# Patient Record
Sex: Female | Born: 1940 | Race: White | Hispanic: No | Marital: Married | Smoking: Former smoker
Health system: Southern US, Community
[De-identification: ages and names within clinical notes are randomized; demographics above are authoritative.]

## PROBLEM LIST (undated history)

## (undated) DIAGNOSIS — K589 Irritable bowel syndrome without diarrhea: Secondary | ICD-10-CM

## (undated) DIAGNOSIS — K5790 Diverticulosis of intestine, part unspecified, without perforation or abscess without bleeding: Secondary | ICD-10-CM

## (undated) DIAGNOSIS — M1990 Unspecified osteoarthritis, unspecified site: Secondary | ICD-10-CM

## (undated) DIAGNOSIS — L409 Psoriasis, unspecified: Secondary | ICD-10-CM

## (undated) DIAGNOSIS — I10 Essential (primary) hypertension: Secondary | ICD-10-CM

## (undated) DIAGNOSIS — Z8719 Personal history of other diseases of the digestive system: Secondary | ICD-10-CM

## (undated) DIAGNOSIS — E0500 Thyrotoxicosis with diffuse goiter without thyrotoxic crisis or storm: Secondary | ICD-10-CM

## (undated) DIAGNOSIS — F419 Anxiety disorder, unspecified: Secondary | ICD-10-CM

## (undated) DIAGNOSIS — J189 Pneumonia, unspecified organism: Secondary | ICD-10-CM

## (undated) DIAGNOSIS — F329 Major depressive disorder, single episode, unspecified: Secondary | ICD-10-CM

## (undated) DIAGNOSIS — H409 Unspecified glaucoma: Secondary | ICD-10-CM

## (undated) DIAGNOSIS — R112 Nausea with vomiting, unspecified: Secondary | ICD-10-CM

## (undated) DIAGNOSIS — Z8739 Personal history of other diseases of the musculoskeletal system and connective tissue: Secondary | ICD-10-CM

## (undated) DIAGNOSIS — K219 Gastro-esophageal reflux disease without esophagitis: Secondary | ICD-10-CM

## (undated) DIAGNOSIS — Z9889 Other specified postprocedural states: Secondary | ICD-10-CM

## (undated) DIAGNOSIS — R51 Headache: Secondary | ICD-10-CM

## (undated) DIAGNOSIS — N390 Urinary tract infection, site not specified: Secondary | ICD-10-CM

## (undated) DIAGNOSIS — T8484XA Pain due to internal orthopedic prosthetic devices, implants and grafts, initial encounter: Secondary | ICD-10-CM

## (undated) DIAGNOSIS — F32A Depression, unspecified: Secondary | ICD-10-CM

## (undated) HISTORY — PX: CHOLECYSTECTOMY: SHX55

## (undated) HISTORY — PX: KNEE ARTHROSCOPY W/ ACL RECONSTRUCTION: SHX1858

## (undated) HISTORY — PX: HIP SURGERY: SHX245

## (undated) HISTORY — PX: TOTAL HIP ARTHROPLASTY: SHX124

---

## 1998-12-03 ENCOUNTER — Other Ambulatory Visit: Admission: RE | Admit: 1998-12-03 | Discharge: 1998-12-03 | Payer: Self-pay | Admitting: Gynecology

## 1999-12-22 ENCOUNTER — Other Ambulatory Visit: Admission: RE | Admit: 1999-12-22 | Discharge: 1999-12-22 | Payer: Self-pay | Admitting: Gynecology

## 2000-05-04 ENCOUNTER — Encounter: Admission: RE | Admit: 2000-05-04 | Discharge: 2000-05-04 | Payer: Self-pay | Admitting: Gastroenterology

## 2000-05-04 ENCOUNTER — Encounter: Payer: Self-pay | Admitting: Gastroenterology

## 2000-12-13 ENCOUNTER — Other Ambulatory Visit: Admission: RE | Admit: 2000-12-13 | Discharge: 2000-12-13 | Payer: Self-pay | Admitting: Gynecology

## 2007-10-10 ENCOUNTER — Emergency Department (HOSPITAL_COMMUNITY): Admission: EM | Admit: 2007-10-10 | Discharge: 2007-10-10 | Payer: Self-pay | Admitting: *Deleted

## 2008-06-19 ENCOUNTER — Encounter: Admission: RE | Admit: 2008-06-19 | Discharge: 2008-06-19 | Payer: Self-pay | Admitting: Gastroenterology

## 2009-03-19 ENCOUNTER — Encounter: Admission: RE | Admit: 2009-03-19 | Discharge: 2009-03-19 | Payer: Self-pay | Admitting: Gastroenterology

## 2009-05-13 ENCOUNTER — Encounter (INDEPENDENT_AMBULATORY_CARE_PROVIDER_SITE_OTHER): Payer: Self-pay | Admitting: General Surgery

## 2009-05-13 ENCOUNTER — Ambulatory Visit (HOSPITAL_COMMUNITY): Admission: RE | Admit: 2009-05-13 | Discharge: 2009-05-13 | Payer: Self-pay | Admitting: General Surgery

## 2009-10-28 ENCOUNTER — Encounter: Admission: RE | Admit: 2009-10-28 | Discharge: 2009-10-28 | Payer: Self-pay | Admitting: Orthopedic Surgery

## 2009-11-10 ENCOUNTER — Encounter: Admission: RE | Admit: 2009-11-10 | Discharge: 2009-11-10 | Payer: Self-pay | Admitting: Orthopedic Surgery

## 2009-12-16 ENCOUNTER — Encounter: Admission: RE | Admit: 2009-12-16 | Discharge: 2009-12-16 | Payer: Self-pay | Admitting: Orthopedic Surgery

## 2009-12-22 ENCOUNTER — Inpatient Hospital Stay (HOSPITAL_COMMUNITY): Admission: RE | Admit: 2009-12-22 | Discharge: 2009-12-24 | Payer: Self-pay | Admitting: Orthopedic Surgery

## 2010-01-02 ENCOUNTER — Ambulatory Visit (HOSPITAL_COMMUNITY): Admission: AD | Admit: 2010-01-02 | Discharge: 2010-01-03 | Payer: Self-pay | Admitting: Orthopedic Surgery

## 2010-02-05 ENCOUNTER — Encounter: Admission: RE | Admit: 2010-02-05 | Discharge: 2010-02-05 | Payer: Self-pay | Admitting: Gynecology

## 2010-03-03 ENCOUNTER — Encounter: Admission: RE | Admit: 2010-03-03 | Discharge: 2010-03-03 | Payer: Self-pay | Admitting: Orthopedic Surgery

## 2010-03-20 ENCOUNTER — Inpatient Hospital Stay (HOSPITAL_COMMUNITY): Admission: RE | Admit: 2010-03-20 | Discharge: 2010-03-25 | Payer: Self-pay | Admitting: Orthopedic Surgery

## 2010-03-20 DIAGNOSIS — T8450XA Infection and inflammatory reaction due to unspecified internal joint prosthesis, initial encounter: Secondary | ICD-10-CM | POA: Insufficient documentation

## 2010-03-24 ENCOUNTER — Ambulatory Visit: Payer: Self-pay | Admitting: Infectious Disease

## 2010-04-06 ENCOUNTER — Inpatient Hospital Stay (HOSPITAL_COMMUNITY): Admission: EM | Admit: 2010-04-06 | Discharge: 2010-04-13 | Payer: Self-pay | Admitting: Emergency Medicine

## 2010-04-16 ENCOUNTER — Encounter: Payer: Self-pay | Admitting: Infectious Diseases

## 2010-04-20 ENCOUNTER — Encounter: Payer: Self-pay | Admitting: Infectious Diseases

## 2010-04-27 ENCOUNTER — Encounter: Payer: Self-pay | Admitting: Infectious Diseases

## 2010-05-04 ENCOUNTER — Encounter: Payer: Self-pay | Admitting: Infectious Diseases

## 2010-05-11 ENCOUNTER — Telehealth: Payer: Self-pay

## 2010-05-20 ENCOUNTER — Ambulatory Visit: Payer: Self-pay | Admitting: Infectious Diseases

## 2010-05-20 ENCOUNTER — Encounter: Payer: Self-pay | Admitting: Infectious Diseases

## 2010-05-20 ENCOUNTER — Telehealth: Payer: Self-pay | Admitting: Infectious Diseases

## 2010-05-20 DIAGNOSIS — K219 Gastro-esophageal reflux disease without esophagitis: Secondary | ICD-10-CM

## 2010-05-20 DIAGNOSIS — M949 Disorder of cartilage, unspecified: Secondary | ICD-10-CM

## 2010-05-20 DIAGNOSIS — M899 Disorder of bone, unspecified: Secondary | ICD-10-CM | POA: Insufficient documentation

## 2010-05-20 DIAGNOSIS — I10 Essential (primary) hypertension: Secondary | ICD-10-CM | POA: Insufficient documentation

## 2010-05-20 DIAGNOSIS — M1990 Unspecified osteoarthritis, unspecified site: Secondary | ICD-10-CM | POA: Insufficient documentation

## 2010-05-25 ENCOUNTER — Encounter: Payer: Self-pay | Admitting: Infectious Diseases

## 2010-05-26 ENCOUNTER — Telehealth: Payer: Self-pay | Admitting: Infectious Diseases

## 2010-06-17 ENCOUNTER — Inpatient Hospital Stay (HOSPITAL_COMMUNITY): Admission: RE | Admit: 2010-06-17 | Discharge: 2010-06-24 | Payer: Self-pay | Admitting: Orthopedic Surgery

## 2010-12-20 ENCOUNTER — Encounter: Payer: Self-pay | Admitting: Orthopedic Surgery

## 2010-12-31 NOTE — Miscellaneous (Signed)
Summary: HIPAA Restrictions  HIPAA Restrictions   Imported By: Florinda Marker 05/20/2010 15:10:13  _____________________________________________________________________  External Attachment:    Type:   Image     Comment:   External Document

## 2010-12-31 NOTE — Assessment & Plan Note (Signed)
Summary: Office Visit (Infectious Disease)    CC:  new patient / hsfu.  History of Present Illness: 70 year old female, who had a right total hip arthroplasty done in [place], [place] in 2005.  She did well until last year when she developed painful swelling in the right thigh.  She presented with a draining abscess.  She has undergone two incisions and drainage and washout, but unable to eradicate the infection.  Recent CT showed communication with the joint and on March 20, 2010 she underwent joint resection. She was d/c home on April 27 on vanco and zosyn IV. Her cx showed diphtheroids. Since resection she has had a fall with femur fracture.  No fevers or chills.  Her ESR at d/c was 55, most recent (05-19-10) 10  Preventive Screening-Counseling & Management  Alcohol-Tobacco     Alcohol drinks/day: 0     Smoking Status: never  Caffeine-Diet-Exercise     Caffeine use/day: coffee,sodas occassionally     Does Patient Exercise: yes     Type of exercise: PT     Exercise (avg: min/session): 30-60     Times/week: <3  Safety-Violence-Falls     Seat Belt Use: yes   Updated Prior Medication List: ASPIRIN 81 MG TABS (ASPIRIN) Take one tablet by mouth once a day. TRAVATAN Z 0.004 % SOLN (TRAVOPROST) One drop in each eye at night ZEGERID 40-1100 MG CAPS (OMEPRAZOLE-SODIUM BICARBONATE) Take one capsule by mouth once a day LOTREL 10-20 MG CAPS (AMLODIPINE BESY-BENAZEPRIL HCL) Take one capsule by mouth daily VANCOMYCIN HCL 1000 MG SOLR (VANCOMYCIN HCL)  CEFAZOLIN SODIUM 1 GM SOLR (CEFAZOLIN SODIUM)   Current Allergies (reviewed today): No known allergies  Past History:  Past Medical History: GERD Hypertension Osteoarthritis Osteopenia Resection of Infected R THR 03-20-10. Cx negative.   Family History: CAD- mother died at 29 Family History Diabetes 1st degree relative  Social History: Married Never Smoked Alcohol use-yes- occas   Review of Systems       not eating  well, small portions, more protein; taking occas colace or miralax; passing urine well; no problems with PIC- no erythema, tenderness. has been itchy and has not drawn well. has had thrush.   Vital Signs:  Patient profile:   70 year old female Height:      64.5 inches (163.83 cm) Temp:     98.0 degrees F (36.67 degrees C) oral Pulse rate:   88 / minute BP sitting:   106 / 68  (left arm)  Vitals Entered By: Baxter Hire) (May 20, 2010 11:37 AM) CC: new patient / hsfu Pain Assessment Patient in pain? yes     Location: upper leg Intensity: 2 Type: dull ache Onset of pain  only happens when leg is moved Nutritional Status Detail appetite is better per patient  Does patient need assistance? Functional Status Self care Ambulation Wheelchair Comments patient uses both wheelchair and walker   Physical Exam  General:  well-developed, well-nourished, well-hydrated, and overweight-appearing.   Eyes:  pupils equal, pupils round, and pupils reactive to light.   Mouth:  pharynx pink and moist and no exudates.   Neck:  no masses.   Lungs:  normal respiratory effort and normal breath sounds.   Heart:  normal rate, regular rhythm, and no murmur.   Abdomen:  soft, non-tender, and normal bowel sounds.   Extremities:  R hip- subcutaneously edema. wound well healed, non-fluctuant. no erythema, non-tender.    Impression & Recommendations:  Problem # 1:  INF&INFLAM REACTION DUE  INTRL JOINT PROSTHESIS [phone])  she seems to be doing very well. would - d/c antibiotics, she has completed more than 10 weeks her original plan. start her on doxy by mouth for suppresive therapy through surgery. plan for reimplantation provided her CRP and ESR stay normal. her husband would like to leave pic in for the next week while watching off IV antibiotics and waiting for repeat labs. I agree with this.   Orders: Consultation Level IV ([id number])  Medications Added to Medication List This Visit: 1)   Aspirin 81 Mg Tabs (Aspirin) .... Take one tablet by mouth once a day. 2)  Travatan Z 0.004 % Soln (Travoprost) .... One drop in each eye at night 3)  Zegerid 40-1100 Mg Caps (Omeprazole-sodium bicarbonate) .... Take one capsule by mouth once a day 4)  Lotrel 10-20 Mg Caps (Amlodipine besy-benazepril hcl) .... Take one capsule by mouth daily 5)  Doxycycline Hyclate 100 Mg Caps (Doxycycline hyclate) .... Take 1 tablet by mouth two times a day Prescriptions: DOXYCYCLINE HYCLATE 100 MG CAPS (DOXYCYCLINE HYCLATE) Take 1 tablet by mouth two times a day  #90 x 0   Entered and Authorized by:   Johny Sax MD   Signed by:   Johny Sax MD on 05/20/2010   Method used:   Print then Give to Patient   RxID:   [id number]

## 2010-12-31 NOTE — Progress Notes (Signed)
Summary: patient has concern about picc being removed  Phone Note Call from Patient   Caller: Spouse Call For: Dr. Johny Sax Summary of Call: Patient's husband has concern about his wife's picc line being pulled today, and the increase in sed rate from 10 last week to 15 this week. Please advise Initial call taken by: Starleen Arms CMA,  May 26, 2010 4:32 PM  Follow-up for Phone Call        this is normal. please pull pic.

## 2010-12-31 NOTE — Progress Notes (Signed)
Summary: ABX, lab and PICC orders called to [facility] per Dr. Ninetta Lights  Phone Note Outgoing Call   Call placed by: Jennet Maduro RN,  May 20, 2010 12:47 PM Call placed to: [facility] Action Taken: Phone Call Completed Summary of Call: Per order from Dr. Ninetta Lights, stop antibiotics, maintain PICC, draw labs on Monday, June 27.  MD will review labs prior to deciding further instructions regarding PICC.  Genevieve Norlander RN verbalized back these orders.  Jennet Maduro RN  May 20, 2010 12:49 PM      G

## 2010-12-31 NOTE — Progress Notes (Signed)
Summary: Orders for [facility] line  Phone Note Other Incoming   Caller: Verlon Au from [place] Summary of Call: Verlon Au from [place] called about this patient and is wanting to know about pulling PIC line. Nurse stated that labs were drawn and are waiting for instructions. Told nurse that physician would be in this afternoon and would be advised on further instructions and would call back with instructions.  Initial call taken by: Kathi Simpers [facility]),  May 26, 2010 11:12 AM  Follow-up for Phone Call        labs (ESR and CRP nl). please pull PIC     Appended Document: Orders for [facility] line Spoke with Verlon Au from [place] and gave verbal orders to pull [facility] as prescribed by Dr. Ninetta Lights. Wise,Paula, CMA May 26, 2010 at 4:19pm.

## 2010-12-31 NOTE — Progress Notes (Signed)
Summary: Abnormal labs   Phone Note Outgoing Call   Call placed by: Tomasita Morrow RN,  May 11, 2010 12:37 PM Summary of Call: Lab rcv'd dated 05-04-10   Vanc trough 23.4.  Per Dr Ninetta Lights, Vanc trough needs to be repeated today.  Genevieve Norlander states they will repeat the lab today anyway per standing orders. Tomasita Morrow RN  May 11, 2010 12:39 PM

## 2011-01-04 ENCOUNTER — Ambulatory Visit: Payer: Medicare Other | Attending: Orthopedic Surgery | Admitting: Physical Therapy

## 2011-01-04 DIAGNOSIS — R269 Unspecified abnormalities of gait and mobility: Secondary | ICD-10-CM | POA: Insufficient documentation

## 2011-01-04 DIAGNOSIS — IMO0001 Reserved for inherently not codable concepts without codable children: Secondary | ICD-10-CM | POA: Insufficient documentation

## 2011-01-04 DIAGNOSIS — Z96649 Presence of unspecified artificial hip joint: Secondary | ICD-10-CM | POA: Insufficient documentation

## 2011-01-12 ENCOUNTER — Ambulatory Visit: Payer: Medicare Other | Admitting: Physical Therapy

## 2011-01-14 ENCOUNTER — Ambulatory Visit: Payer: Medicare Other | Admitting: Physical Therapy

## 2011-01-19 ENCOUNTER — Ambulatory Visit: Payer: Medicare Other | Admitting: Physical Therapy

## 2011-01-21 ENCOUNTER — Ambulatory Visit: Payer: Medicare Other | Admitting: Physical Therapy

## 2011-01-26 ENCOUNTER — Ambulatory Visit: Payer: BC Managed Care – PPO | Admitting: Physical Therapy

## 2011-01-26 ENCOUNTER — Ambulatory Visit: Payer: Medicare Other | Admitting: Physical Therapy

## 2011-01-28 ENCOUNTER — Ambulatory Visit: Payer: Medicare Other | Attending: Orthopedic Surgery | Admitting: Physical Therapy

## 2011-01-28 DIAGNOSIS — R269 Unspecified abnormalities of gait and mobility: Secondary | ICD-10-CM | POA: Insufficient documentation

## 2011-01-28 DIAGNOSIS — Z96649 Presence of unspecified artificial hip joint: Secondary | ICD-10-CM | POA: Insufficient documentation

## 2011-01-28 DIAGNOSIS — IMO0001 Reserved for inherently not codable concepts without codable children: Secondary | ICD-10-CM | POA: Insufficient documentation

## 2011-02-02 ENCOUNTER — Ambulatory Visit: Payer: Medicare Other | Admitting: Physical Therapy

## 2011-02-04 ENCOUNTER — Ambulatory Visit: Payer: Medicare Other | Admitting: Physical Therapy

## 2011-02-09 ENCOUNTER — Ambulatory Visit: Payer: Medicare Other | Admitting: Physical Therapy

## 2011-02-11 ENCOUNTER — Ambulatory Visit: Payer: Medicare Other | Admitting: Physical Therapy

## 2011-02-13 LAB — ANAEROBIC CULTURE

## 2011-02-13 LAB — CBC
HCT: 26.5 % — ABNORMAL LOW (ref 36.0–46.0)
HCT: 27 % — ABNORMAL LOW (ref 36.0–46.0)
HCT: 40.2 % (ref 36.0–46.0)
Hemoglobin: 10.9 g/dL — ABNORMAL LOW (ref 12.0–15.0)
Hemoglobin: 13.7 g/dL (ref 12.0–15.0)
Hemoglobin: 9.2 g/dL — ABNORMAL LOW (ref 12.0–15.0)
MCH: 31.1 pg (ref 26.0–34.0)
MCH: 31.9 pg (ref 26.0–34.0)
MCHC: 33.8 g/dL (ref 30.0–36.0)
MCHC: 34 g/dL (ref 30.0–36.0)
MCHC: 34.1 g/dL (ref 30.0–36.0)
MCHC: 34.7 g/dL (ref 30.0–36.0)
MCV: 91.6 fL (ref 78.0–100.0)
Platelets: 142 10*3/uL — ABNORMAL LOW (ref 150–400)
Platelets: 168 10*3/uL (ref 150–400)
Platelets: 194 10*3/uL (ref 150–400)
Platelets: 231 10*3/uL (ref 150–400)
RBC: 2.82 MIL/uL — ABNORMAL LOW (ref 3.87–5.11)
RBC: 2.84 MIL/uL — ABNORMAL LOW (ref 3.87–5.11)
RBC: 2.92 MIL/uL — ABNORMAL LOW (ref 3.87–5.11)
RBC: 2.97 MIL/uL — ABNORMAL LOW (ref 3.87–5.11)
RBC: 4.39 MIL/uL (ref 3.87–5.11)
RDW: 14.4 % (ref 11.5–15.5)
RDW: 15 % (ref 11.5–15.5)
WBC: 10 10*3/uL (ref 4.0–10.5)
WBC: 11 10*3/uL — ABNORMAL HIGH (ref 4.0–10.5)
WBC: 14.5 10*3/uL — ABNORMAL HIGH (ref 4.0–10.5)
WBC: 5.9 10*3/uL (ref 4.0–10.5)
WBC: 6.4 10*3/uL (ref 4.0–10.5)
WBC: 7.4 10*3/uL (ref 4.0–10.5)
WBC: 8.6 10*3/uL (ref 4.0–10.5)

## 2011-02-13 LAB — PROTIME-INR
INR: 1.44 (ref 0.00–1.49)
INR: 1.65 — ABNORMAL HIGH (ref 0.00–1.49)
INR: 1.89 — ABNORMAL HIGH (ref 0.00–1.49)
INR: 2.39 — ABNORMAL HIGH (ref 0.00–1.49)
Prothrombin Time: 13.3 seconds (ref 11.6–15.2)
Prothrombin Time: 17.4 seconds — ABNORMAL HIGH (ref 11.6–15.2)
Prothrombin Time: 18.6 seconds — ABNORMAL HIGH (ref 11.6–15.2)
Prothrombin Time: 20.7 seconds — ABNORMAL HIGH (ref 11.6–15.2)
Prothrombin Time: 21.5 seconds — ABNORMAL HIGH (ref 11.6–15.2)
Prothrombin Time: 22.8 seconds — ABNORMAL HIGH (ref 11.6–15.2)

## 2011-02-13 LAB — BASIC METABOLIC PANEL
BUN: 10 mg/dL (ref 6–23)
Calcium: 7.8 mg/dL — ABNORMAL LOW (ref 8.4–10.5)
Calcium: 7.9 mg/dL — ABNORMAL LOW (ref 8.4–10.5)
Calcium: 8 mg/dL — ABNORMAL LOW (ref 8.4–10.5)
Creatinine, Ser: 0.72 mg/dL (ref 0.4–1.2)
Creatinine, Ser: 0.94 mg/dL (ref 0.4–1.2)
GFR calc Af Amer: 47 mL/min — ABNORMAL LOW (ref >60)
GFR calc Af Amer: 55 mL/min — ABNORMAL LOW (ref >60)
GFR calc Af Amer: >60 mL/min (ref >60)
GFR calc Af Amer: >60 mL/min (ref >60)
GFR calc non Af Amer: 39 mL/min — ABNORMAL LOW (ref >60)
GFR calc non Af Amer: 46 mL/min — ABNORMAL LOW (ref >60)
GFR calc non Af Amer: 60 mL/min — ABNORMAL LOW (ref >60)
Glucose, Bld: 102 mg/dL — ABNORMAL HIGH (ref 70–99)
Potassium: 3.9 mEq/L (ref 3.5–5.1)
Potassium: 5 mEq/L (ref 3.5–5.1)
Sodium: 133 mEq/L — ABNORMAL LOW (ref 135–145)
Sodium: 138 mEq/L (ref 135–145)

## 2011-02-13 LAB — TYPE AND SCREEN

## 2011-02-13 LAB — BODY FLUID CULTURE
Culture: NO GROWTH
Gram Stain: NONE SEEN

## 2011-02-13 LAB — COMPREHENSIVE METABOLIC PANEL
AST: 25 U/L (ref 0–37)
CO2: 27 mEq/L (ref 19–32)
Calcium: 9.4 mg/dL (ref 8.4–10.5)
Chloride: 106 mEq/L (ref 96–112)
Creatinine, Ser: 1.05 mg/dL (ref 0.4–1.2)
GFR calc non Af Amer: 52 mL/min — ABNORMAL LOW (ref >60)
Glucose, Bld: 108 mg/dL — ABNORMAL HIGH (ref 70–99)
Total Bilirubin: 0.8 mg/dL (ref 0.3–1.2)

## 2011-02-13 LAB — POCT I-STAT 4, (NA,K, GLUC, HGB,HCT)
HCT: 29 % — ABNORMAL LOW (ref 36.0–46.0)
Hemoglobin: 9.9 g/dL — ABNORMAL LOW (ref 12.0–15.0)
Potassium: 3.5 mEq/L (ref 3.5–5.1)
Sodium: 142 mEq/L (ref 135–145)

## 2011-02-13 LAB — URINE MICROSCOPIC-ADD ON

## 2011-02-13 LAB — GRAM STAIN

## 2011-02-13 LAB — URINALYSIS, ROUTINE W REFLEX MICROSCOPIC
Bilirubin Urine: NEGATIVE
Ketones, ur: NEGATIVE mg/dL
Specific Gravity, Urine: 1.02 (ref 1.005–1.030)

## 2011-02-13 LAB — PREPARE RBC (CROSSMATCH)

## 2011-02-13 LAB — SURGICAL PCR SCREEN: MRSA, PCR: NEGATIVE

## 2011-02-13 LAB — APTT: aPTT: 23 seconds — ABNORMAL LOW (ref 24–37)

## 2011-02-14 LAB — URINE MICROSCOPIC-ADD ON

## 2011-02-14 LAB — BASIC METABOLIC PANEL
BUN: 11 mg/dL (ref 6–23)
Calcium: 8.3 mg/dL — ABNORMAL LOW (ref 8.4–10.5)
Calcium: 8.4 mg/dL (ref 8.4–10.5)
GFR calc Af Amer: >60 mL/min (ref >60)
GFR calc non Af Amer: 60 mL/min — ABNORMAL LOW (ref >60)
GFR calc non Af Amer: >60 mL/min (ref >60)
Glucose, Bld: 93 mg/dL (ref 70–99)
Sodium: 134 mEq/L — ABNORMAL LOW (ref 135–145)
Sodium: 137 mEq/L (ref 135–145)

## 2011-02-14 LAB — COMPREHENSIVE METABOLIC PANEL
Albumin: 3.3 g/dL — ABNORMAL LOW (ref 3.5–5.2)
Alkaline Phosphatase: 69 U/L (ref 39–117)
BUN: 15 mg/dL (ref 6–23)
Calcium: 8.8 mg/dL (ref 8.4–10.5)
Creatinine, Ser: 0.99 mg/dL (ref 0.4–1.2)
Potassium: 3.9 mEq/L (ref 3.5–5.1)
Total Protein: 6.7 g/dL (ref 6.0–8.3)

## 2011-02-14 LAB — CBC
HCT: 44.2 % (ref 36.0–46.0)
Hemoglobin: 12.9 g/dL (ref 12.0–15.0)
MCHC: 34 g/dL (ref 30.0–36.0)
Platelets: 246 10*3/uL (ref 150–400)
Platelets: 296 10*3/uL (ref 150–400)
RDW: 13.3 % (ref 11.5–15.5)
RDW: 13.7 % (ref 11.5–15.5)

## 2011-02-14 LAB — ANAEROBIC CULTURE

## 2011-02-14 LAB — TISSUE CULTURE

## 2011-02-14 LAB — URINALYSIS, ROUTINE W REFLEX MICROSCOPIC
Glucose, UA: NEGATIVE mg/dL
Specific Gravity, Urine: 1.024 (ref 1.005–1.030)
pH: 6.5 (ref 5.0–8.0)

## 2011-02-14 LAB — WOUND CULTURE: Culture: NO GROWTH

## 2011-02-14 LAB — GRAM STAIN

## 2011-02-15 ENCOUNTER — Ambulatory Visit: Payer: BC Managed Care – PPO | Admitting: Physical Therapy

## 2011-02-15 LAB — PROTIME-INR
INR: 1.98 — ABNORMAL HIGH (ref 0.00–1.49)
Prothrombin Time: 23.8 seconds — ABNORMAL HIGH (ref 11.6–15.2)

## 2011-02-16 ENCOUNTER — Ambulatory Visit: Payer: Medicare Other | Admitting: Physical Therapy

## 2011-02-16 LAB — PROTIME-INR
INR: 1.57 — ABNORMAL HIGH (ref 0.00–1.49)
INR: 1.22 (ref 0.00–1.49)
INR: 1.23 (ref 0.00–1.49)
Prothrombin Time: 13.5 seconds (ref 11.6–15.2)
Prothrombin Time: 14.3 seconds (ref 11.6–15.2)
Prothrombin Time: 16.9 seconds — ABNORMAL HIGH (ref 11.6–15.2)
Prothrombin Time: 18.8 seconds — ABNORMAL HIGH (ref 11.6–15.2)
Prothrombin Time: 20.3 seconds — ABNORMAL HIGH (ref 11.6–15.2)
Prothrombin Time: 13.3 seconds (ref 11.6–15.2)
Prothrombin Time: 14.3 seconds (ref 11.6–15.2)
Prothrombin Time: 15.3 seconds — ABNORMAL HIGH (ref 11.6–15.2)
Prothrombin Time: 15.4 seconds — ABNORMAL HIGH (ref 11.6–15.2)

## 2011-02-16 LAB — ANAEROBIC CULTURE

## 2011-02-16 LAB — CBC
HCT: 31.1 % — ABNORMAL LOW (ref 36.0–46.0)
Hemoglobin: 9.9 g/dL — ABNORMAL LOW (ref 12.0–15.0)
MCHC: 32.9 g/dL (ref 30.0–36.0)
MCHC: 33 g/dL (ref 30.0–36.0)
MCHC: 33.9 g/dL (ref 30.0–36.0)
MCHC: 33.3 g/dL (ref 30.0–36.0)
MCHC: 33.6 g/dL (ref 30.0–36.0)
MCHC: 33.8 g/dL (ref 30.0–36.0)
MCHC: 33.8 g/dL (ref 30.0–36.0)
MCV: 92.5 fL (ref 78.0–100.0)
MCV: 93.2 fL (ref 78.0–100.0)
MCV: 93.9 fL (ref 78.0–100.0)
Platelets: 203 10*3/uL (ref 150–400)
Platelets: 203 10*3/uL (ref 150–400)
Platelets: 238 10*3/uL (ref 150–400)
Platelets: 245 10*3/uL (ref 150–400)
Platelets: 221 10*3/uL (ref 150–400)
Platelets: 232 10*3/uL (ref 150–400)
RBC: 2.75 MIL/uL — ABNORMAL LOW (ref 3.87–5.11)
RDW: 14.2 % (ref 11.5–15.5)
RDW: 14.5 % (ref 11.5–15.5)
RDW: 14.9 % (ref 11.5–15.5)
RDW: 14.2 % (ref 11.5–15.5)
RDW: 14.4 % (ref 11.5–15.5)
RDW: 14.5 % (ref 11.5–15.5)
RDW: 14.5 % (ref 11.5–15.5)
RDW: 14.6 % (ref 11.5–15.5)
WBC: 7.8 10*3/uL (ref 4.0–10.5)
WBC: 9.3 10*3/uL (ref 4.0–10.5)

## 2011-02-16 LAB — COMPREHENSIVE METABOLIC PANEL
ALT: 13 U/L (ref 0–35)
Calcium: 9.4 mg/dL (ref 8.4–10.5)
Creatinine, Ser: 1.06 mg/dL (ref 0.4–1.2)
GFR calc Af Amer: >60 mL/min (ref >60)
Glucose, Bld: 92 mg/dL (ref 70–99)
Sodium: 141 mEq/L (ref 135–145)
Total Protein: 7 g/dL (ref 6.0–8.3)

## 2011-02-16 LAB — BASIC METABOLIC PANEL
BUN: 10 mg/dL (ref 6–23)
BUN: 13 mg/dL (ref 6–23)
BUN: 7 mg/dL (ref 6–23)
BUN: 8 mg/dL (ref 6–23)
BUN: 9 mg/dL (ref 6–23)
CO2: 24 mEq/L (ref 19–32)
CO2: 26 mEq/L (ref 19–32)
Calcium: 8.3 mg/dL — ABNORMAL LOW (ref 8.4–10.5)
Calcium: 9.1 mg/dL (ref 8.4–10.5)
Calcium: 8 mg/dL — ABNORMAL LOW (ref 8.4–10.5)
Calcium: 8.1 mg/dL — ABNORMAL LOW (ref 8.4–10.5)
Chloride: 102 mEq/L (ref 96–112)
Creatinine, Ser: 0.67 mg/dL (ref 0.4–1.2)
Creatinine, Ser: 0.76 mg/dL (ref 0.4–1.2)
Creatinine, Ser: 0.76 mg/dL (ref 0.4–1.2)
Creatinine, Ser: 0.83 mg/dL (ref 0.4–1.2)
Creatinine, Ser: 0.83 mg/dL (ref 0.4–1.2)
Creatinine, Ser: 1.01 mg/dL (ref 0.4–1.2)
GFR calc Af Amer: >60 mL/min (ref >60)
GFR calc non Af Amer: >60 mL/min (ref >60)
GFR calc non Af Amer: >60 mL/min (ref >60)
GFR calc non Af Amer: >60 mL/min (ref >60)
GFR calc non Af Amer: >60 mL/min (ref >60)
Glucose, Bld: 114 mg/dL — ABNORMAL HIGH (ref 70–99)
Glucose, Bld: 116 mg/dL — ABNORMAL HIGH (ref 70–99)
Glucose, Bld: 123 mg/dL — ABNORMAL HIGH (ref 70–99)
Glucose, Bld: 110 mg/dL — ABNORMAL HIGH (ref 70–99)
Glucose, Bld: 118 mg/dL — ABNORMAL HIGH (ref 70–99)
Glucose, Bld: 163 mg/dL — ABNORMAL HIGH (ref 70–99)
Potassium: 4.3 mEq/L (ref 3.5–5.1)
Potassium: 4 mEq/L (ref 3.5–5.1)

## 2011-02-16 LAB — URINALYSIS, ROUTINE W REFLEX MICROSCOPIC
Bilirubin Urine: NEGATIVE
Glucose, UA: NEGATIVE mg/dL
Glucose, UA: NEGATIVE mg/dL
Ketones, ur: NEGATIVE mg/dL
Nitrite: NEGATIVE
Protein, ur: NEGATIVE mg/dL
Protein, ur: NEGATIVE mg/dL
Urobilinogen, UA: 0.2 mg/dL (ref 0.0–1.0)

## 2011-02-16 LAB — BODY FLUID CULTURE
Culture: NO GROWTH
Gram Stain: NONE SEEN

## 2011-02-16 LAB — CROSSMATCH

## 2011-02-16 LAB — TYPE AND SCREEN: ABO/RH(D): A POS

## 2011-02-16 LAB — DIFFERENTIAL
Basophils Absolute: 0 10*3/uL (ref 0.0–0.1)
Basophils Relative: 0 % (ref 0–1)
Eosinophils Absolute: 0.4 10*3/uL (ref 0.0–0.7)
Eosinophils Relative: 1 % (ref 0–5)
Lymphocytes Relative: 4 % — ABNORMAL LOW (ref 12–46)
Monocytes Relative: 11 % (ref 3–12)
Neutro Abs: 7.7 10*3/uL (ref 1.7–7.7)
Neutrophils Relative %: 75 % (ref 43–77)

## 2011-02-16 LAB — APTT
aPTT: 27 seconds (ref 24–37)
aPTT: 25 seconds (ref 24–37)

## 2011-02-16 LAB — URINE MICROSCOPIC-ADD ON

## 2011-02-16 LAB — C-REACTIVE PROTEIN: CRP: 12.7 mg/dL — ABNORMAL HIGH (ref <0.6)

## 2011-02-16 LAB — ABO/RH: ABO/RH(D): A POS

## 2011-02-16 LAB — HIV ANTIBODY (ROUTINE TESTING W REFLEX): HIV: NONREACTIVE

## 2011-02-17 LAB — DIFFERENTIAL
Basophils Absolute: 0.1 10*3/uL (ref 0.0–0.1)
Lymphocytes Relative: 11 % — ABNORMAL LOW (ref 12–46)
Lymphs Abs: 0.9 10*3/uL (ref 0.7–4.0)
Neutro Abs: 6.6 10*3/uL (ref 1.7–7.7)
Neutrophils Relative %: 77 % (ref 43–77)

## 2011-02-17 LAB — COMPREHENSIVE METABOLIC PANEL
AST: 35 U/L (ref 0–37)
BUN: 13 mg/dL (ref 6–23)
CO2: 22 mEq/L (ref 19–32)
Calcium: 8.9 mg/dL (ref 8.4–10.5)
Creatinine, Ser: 1 mg/dL (ref 0.4–1.2)
GFR calc Af Amer: >60 mL/min (ref >60)
GFR calc non Af Amer: 55 mL/min — ABNORMAL LOW (ref >60)
Glucose, Bld: 78 mg/dL (ref 70–99)

## 2011-02-17 LAB — PROTIME-INR
INR: 1 (ref 0.00–1.49)
Prothrombin Time: 13.1 seconds (ref 11.6–15.2)

## 2011-02-17 LAB — GRAM STAIN

## 2011-02-17 LAB — ANAEROBIC CULTURE

## 2011-02-17 LAB — CBC
HCT: 39.9 % (ref 36.0–46.0)
MCHC: 34.1 g/dL (ref 30.0–36.0)
MCV: 95 fL (ref 78.0–100.0)
RBC: 4.2 MIL/uL (ref 3.87–5.11)

## 2011-02-17 LAB — CULTURE, ROUTINE-ABSCESS

## 2011-02-18 ENCOUNTER — Ambulatory Visit: Payer: Medicare Other | Admitting: Physical Therapy

## 2011-02-23 ENCOUNTER — Ambulatory Visit: Payer: BC Managed Care – PPO | Admitting: Physical Therapy

## 2011-02-25 ENCOUNTER — Ambulatory Visit: Payer: BC Managed Care – PPO | Admitting: Physical Therapy

## 2011-03-02 ENCOUNTER — Ambulatory Visit: Payer: Medicare Other | Attending: Orthopedic Surgery | Admitting: Physical Therapy

## 2011-03-02 DIAGNOSIS — R269 Unspecified abnormalities of gait and mobility: Secondary | ICD-10-CM | POA: Insufficient documentation

## 2011-03-02 DIAGNOSIS — Z96649 Presence of unspecified artificial hip joint: Secondary | ICD-10-CM | POA: Insufficient documentation

## 2011-03-02 DIAGNOSIS — IMO0001 Reserved for inherently not codable concepts without codable children: Secondary | ICD-10-CM | POA: Insufficient documentation

## 2011-03-04 ENCOUNTER — Ambulatory Visit: Payer: Medicare Other | Admitting: Physical Therapy

## 2011-03-08 ENCOUNTER — Ambulatory Visit: Payer: Medicare Other | Admitting: Physical Therapy

## 2011-03-08 LAB — BASIC METABOLIC PANEL
CO2: 22 mEq/L (ref 19–32)
Chloride: 112 mEq/L (ref 96–112)
Creatinine, Ser: 0.94 mg/dL (ref 0.4–1.2)
GFR calc Af Amer: >60 mL/min (ref >60)

## 2011-03-08 LAB — HEMOGLOBIN AND HEMATOCRIT, BLOOD: HCT: 42.3 % (ref 36.0–46.0)

## 2011-03-16 ENCOUNTER — Ambulatory Visit: Payer: BC Managed Care – PPO | Admitting: Physical Therapy

## 2011-03-18 ENCOUNTER — Ambulatory Visit: Payer: BC Managed Care – PPO | Admitting: Physical Therapy

## 2011-03-22 ENCOUNTER — Ambulatory Visit: Payer: BC Managed Care – PPO | Admitting: Physical Therapy

## 2011-03-23 ENCOUNTER — Ambulatory Visit: Payer: BC Managed Care – PPO | Admitting: Physical Therapy

## 2011-03-25 ENCOUNTER — Ambulatory Visit: Payer: BC Managed Care – PPO | Admitting: Physical Therapy

## 2011-03-25 ENCOUNTER — Ambulatory Visit: Payer: Medicare Other | Admitting: Physical Therapy

## 2011-03-30 ENCOUNTER — Ambulatory Visit: Payer: Medicare Other | Attending: Orthopedic Surgery | Admitting: Physical Therapy

## 2011-03-30 DIAGNOSIS — R269 Unspecified abnormalities of gait and mobility: Secondary | ICD-10-CM | POA: Insufficient documentation

## 2011-03-30 DIAGNOSIS — Z96649 Presence of unspecified artificial hip joint: Secondary | ICD-10-CM | POA: Insufficient documentation

## 2011-03-30 DIAGNOSIS — IMO0001 Reserved for inherently not codable concepts without codable children: Secondary | ICD-10-CM | POA: Insufficient documentation

## 2011-03-31 ENCOUNTER — Encounter (HOSPITAL_COMMUNITY)
Admission: RE | Admit: 2011-03-31 | Discharge: 2011-03-31 | Disposition: A | Discharge status: Home / Self Care | Payer: Medicare Other | Source: Ambulatory Visit | Attending: Neurological Surgery | Admitting: Neurological Surgery

## 2011-03-31 ENCOUNTER — Other Ambulatory Visit (HOSPITAL_COMMUNITY): Payer: Self-pay | Admitting: Neurological Surgery

## 2011-03-31 DIAGNOSIS — M47812 Spondylosis without myelopathy or radiculopathy, cervical region: Secondary | ICD-10-CM

## 2011-03-31 LAB — DIFFERENTIAL
Eosinophils Relative: 1 % (ref 0–5)
Lymphocytes Relative: 17 % (ref 12–46)
Lymphs Abs: 1.5 10*3/uL (ref 0.7–4.0)
Neutrophils Relative %: 72 % (ref 43–77)

## 2011-03-31 LAB — BASIC METABOLIC PANEL
Chloride: 99 mEq/L (ref 96–112)
GFR calc Af Amer: >60 mL/min (ref >60)
Potassium: 5 mEq/L (ref 3.5–5.1)

## 2011-03-31 LAB — CBC
HCT: 39.6 % (ref 36.0–46.0)
MCV: 92.3 fL (ref 78.0–100.0)
RBC: 4.29 MIL/uL (ref 3.87–5.11)
WBC: 8.8 10*3/uL (ref 4.0–10.5)

## 2011-03-31 LAB — APTT: aPTT: 23 seconds — ABNORMAL LOW (ref 24–37)

## 2011-03-31 LAB — SURGICAL PCR SCREEN
MRSA, PCR: NEGATIVE
Staphylococcus aureus: NEGATIVE

## 2011-04-01 ENCOUNTER — Ambulatory Visit: Payer: Medicare Other | Admitting: Physical Therapy

## 2011-04-07 ENCOUNTER — Observation Stay (HOSPITAL_COMMUNITY)
Admission: RE | Admit: 2011-04-07 | Discharge: 2011-04-08 | Disposition: A | Discharge status: Home / Self Care | Payer: Medicare Other | Source: Ambulatory Visit | Attending: Neurological Surgery | Admitting: Neurological Surgery

## 2011-04-07 ENCOUNTER — Inpatient Hospital Stay (HOSPITAL_COMMUNITY): Payer: Medicare Other

## 2011-04-07 DIAGNOSIS — M25519 Pain in unspecified shoulder: Secondary | ICD-10-CM | POA: Insufficient documentation

## 2011-04-07 DIAGNOSIS — Z01818 Encounter for other preprocedural examination: Secondary | ICD-10-CM | POA: Insufficient documentation

## 2011-04-07 DIAGNOSIS — M47812 Spondylosis without myelopathy or radiculopathy, cervical region: Principal | ICD-10-CM | POA: Insufficient documentation

## 2011-04-07 DIAGNOSIS — Z01812 Encounter for preprocedural laboratory examination: Secondary | ICD-10-CM | POA: Insufficient documentation

## 2011-04-13 NOTE — Op Note (Signed)
NAME:  Jodi, Howell NO.:  [id number]   MEDICAL RECORD NO.:  [id number]          PATIENT TYPE:  AMB   LOCATION:  DAY                          FACILITY:  [facility]   PHYSICIAN:  Anselm Pancoast. Weatherly, M.D.DATE OF BIRTH:  12-09-1940   DATE OF PROCEDURE:  05/13/2009  DATE OF DISCHARGE:                               OPERATIVE REPORT   PREOPERATIVE DIAGNOSIS:  Chronic cholecystitis with stones.   POSTOPERATIVE DIAGNOSIS:  Chronic cholecystitis with stones.   OPERATION PERFORMED:  Laparoscopic cholecystectomy with cholangiogram.   SURGEON:  Anselm Pancoast. Zachery Dakins, M.D.   ANESTHESIA:  General.   INDICATIONS FOR PROCEDURE:  Jodi Howell is a 70 year old female who  was referred to me by Dr. Vilinda Boehringer after she has had repeated  episodes of epigastric pain, first thought to be esophageal reflux for  years but recently had more symptoms and these occur most commonly at  night.  She had an ultrasound that showed multiple gallstones and she is  here.  She is followed by the [facility] group and her chronic  medications are blood pressure  and other medications.   On physical exam she is not acutely tender but she gives these kind of  continuous nausea and repeat episodes, the more common attacks do occur  usually going to bed.  She is followed by Dr. Nehemiah Settle for her regular  medical care.   DESCRIPTION OF PROCEDURE:  The patient was taken to the operating suite.  She has received intravenous antibiotics.  I think it is Unasyn.  She  has PAS stockings in position on the OR table.  Induction of general  anesthesia and endotracheal tube.  The abdomen was prepped with Betadine  solution and draped in a sterile manner and then time out completed.  A  small incision was made below the umbilicus.  She has a little bit of  excess adipose tissue.  The fascia was identified.  Small opening made,  the fascia picked up with two Kochers.  The underlying posterior  peritoneum was  identified, picked up with a hemostat and a small opening  made.  I then placed a pursestring suture of 0 Vicryl and we placed the  Hasson cannula.  CO2 was infused and on looking in the right upper  quadrant, she has got a kind of an indentation of her liver where most  likely it is the ribs kind of pushing on this, but the gallbladder has a  lot of chronic adhesions around it as if she has had repeated attacks.  The upper 10 mm trocar was placed under direct vision, entered to the  right of the falciform.  The two lateral 5 mm trocars were placed after  anesthetizing the fascia at the appropriate lateral position.  The  adhesions were carefully taken down.  The omentum was first freed from  the gallbladder so you could see it better and then the area that was  adherent to the gallbladder more proximally was carefully dissected down  identifying the junction of the gallbladder and the cystic duct.  The  cystic artery was identified.  This was  doubly clipped proximally,  singly distally and divided, then I encompassed the junction of the  cystic duct, gallbladder and placed a clip across it.  The cystic duct  proximally was opened and we milked out a lot of kind of cholesterol  muddy type stones and then there was good flow of bile.  Catheter was  inserted and held in place with a clip, cholangiogram obtained and it  shows good prompt filling of the extrahepatic biliary system, good flow  into the duodenum and then with a little more pressure you can see the  intrahepatic radicals nicely.  The catheter was removed.  The cystic  duct was triply clipped and then divided and then the posterior branch  of the cystic artery area was clipped and then using the hook  electrocautery, the gallbladder was freed from its bed.  There was good  hemostasis.  The gallbladder was placed in the EndoCatch bag and then  after irrigating and aspirating, we were satisfied with the bleeding.  The gallbladder  was removed with a grasper at the umbilicus removing the  Hasson port.  The CO2 was placed in the upper 10 mm trocar.  We then  used a second 0 Vicryl with a larger needle to kind of encompass closing  the fascia incorporating the anterior and the peritoneum in a single  figure-of-eight suture.  Both sutures were tied and the fascia was  anesthetized.  The subcutaneous tissue was irrigated.  The 5 mm ports  were withdrawn under direct vision.  The CO2 released.  The upper 10 mm  trocar withdrawn and then the subcutaneous wounds were closed with  benzoin with Vicryl 4-0 and then benzoin and Steri-Strips on the skin.  The patient tolerated the procedure nicely and I think she should be  symptomatically improved because of the fairly marked adhesions even  though she has never had an attack that she actually had to go to the  emergency room.  The patient will decide whether she goes home today or  tomorrow.      Anselm Pancoast. Zachery Dakins, M.D.  Electronically Signed     WJW/MEDQ  D:  05/13/2009  T:  05/13/2009  Job:  [id number]   cc:   Fayrene Fearing L. Malon Kindle., M.D.  Fax: [phone]   Deirdre Peer. Polite, M.D.

## 2011-04-27 ENCOUNTER — Ambulatory Visit: Payer: Medicare Other | Admitting: Physical Therapy

## 2011-04-29 ENCOUNTER — Ambulatory Visit: Payer: Medicare Other | Admitting: Physical Therapy

## 2011-05-04 ENCOUNTER — Ambulatory Visit: Payer: Medicare Other | Attending: Orthopedic Surgery | Admitting: Physical Therapy

## 2011-05-04 DIAGNOSIS — Z96649 Presence of unspecified artificial hip joint: Secondary | ICD-10-CM | POA: Insufficient documentation

## 2011-05-04 DIAGNOSIS — IMO0001 Reserved for inherently not codable concepts without codable children: Secondary | ICD-10-CM | POA: Insufficient documentation

## 2011-05-04 DIAGNOSIS — R269 Unspecified abnormalities of gait and mobility: Secondary | ICD-10-CM | POA: Insufficient documentation

## 2011-05-06 ENCOUNTER — Ambulatory Visit: Payer: Medicare Other | Admitting: Physical Therapy

## 2011-05-08 IMAGING — CT CT EXTREM LOW W/ CM*R*
3 series · 15 of 33 positions shown, 18 images · IV contrast ([ID] OMNI 300)
Comparison: MRI of the right lower extremity performed 11/10/2009

CLINICAL DATA: Right thigh abscess; status post minimal ultrasound-
guided fluid aspiration.

CT RIGHT LOWER EXTREMITY WITH CONTRAST
TECHNIQUE: Multidetector CT imaging of the right hip and proximal
thigh was performed according to the standard protocol following
intravenous contrast administration. Multiplanar CT image
reconstructions were also generated.
Contrast: 125 mL of Omnipaque 300 IV contrast

[Series 2: rt thigh soft · axial · 0.66mm/px · z∈[-392,-77]mm · 7 of 74 slices shown, 9 images]
[im 6/74  soft-tissue]
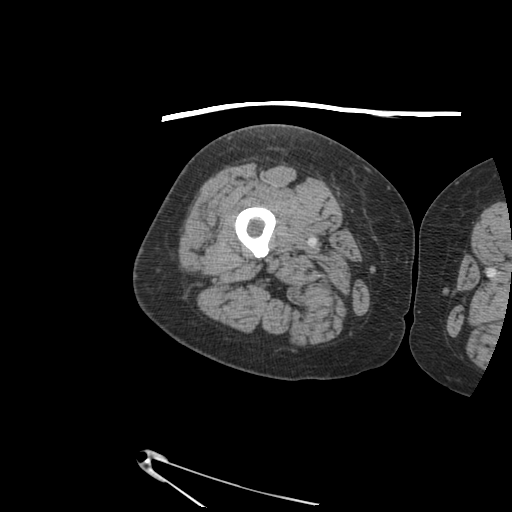
[im 6/74  bone]
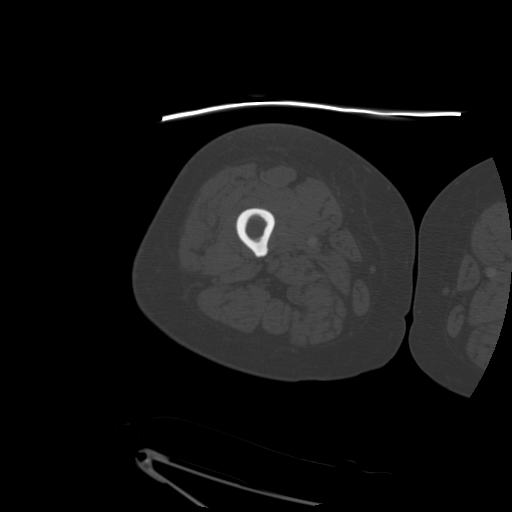
[im 17/74  bone]
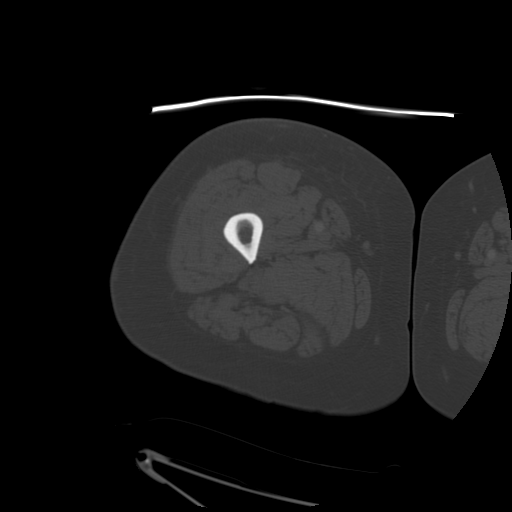
[im 29/74  bone]
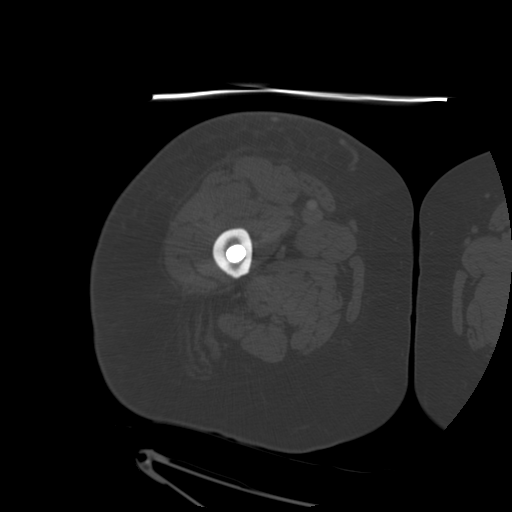
[im 40/74  bone]
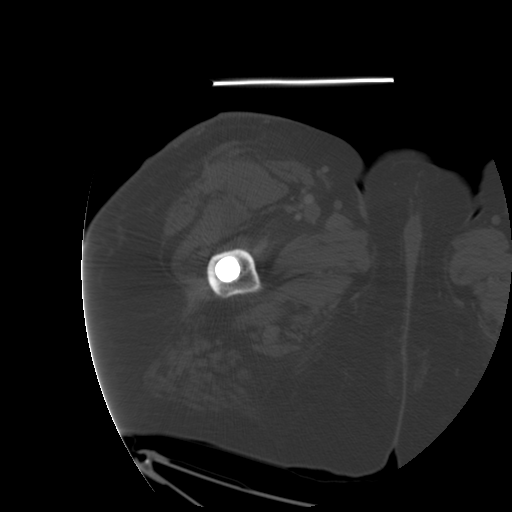
[im 45/74  soft-tissue]
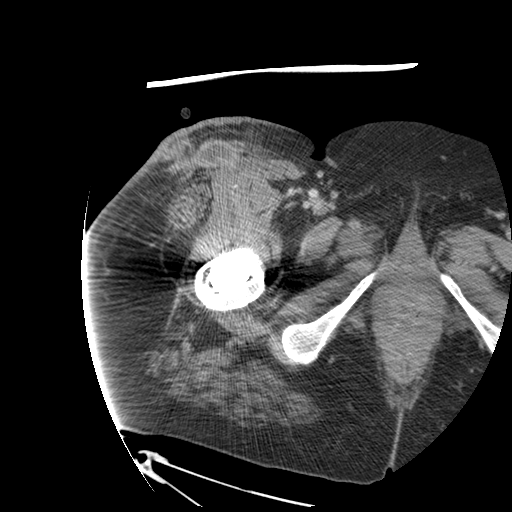
[im 45/74  bone]
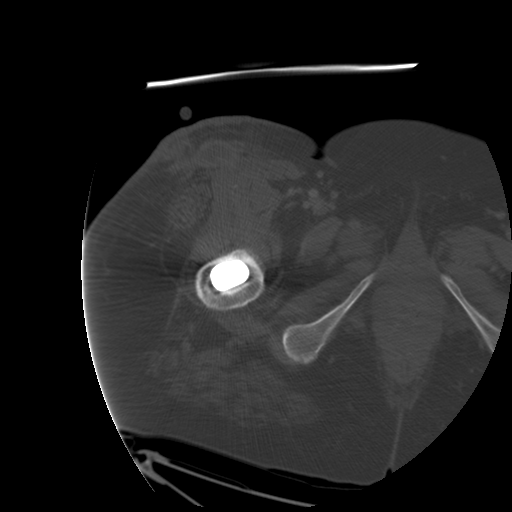
[im 57/74  bone]
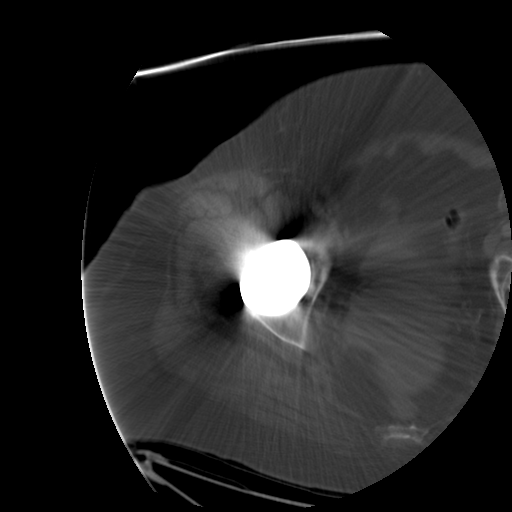
[im 68/74  bone]
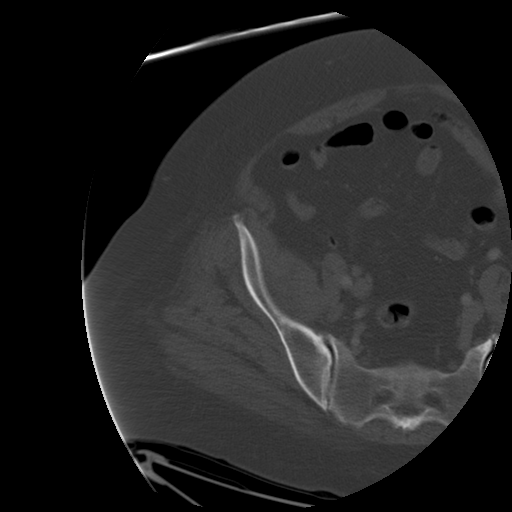

[Series 300: cor rt thigh w/ · coronal · 0.74mm/px · 3 of 132 slices shown]
[im 27/132  bone]
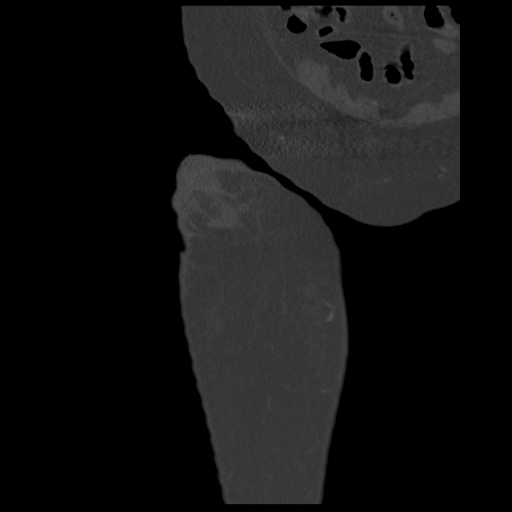
[im 53/132  bone]
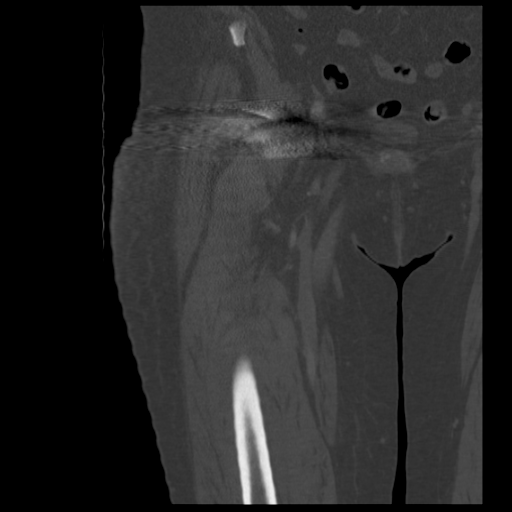
[im 79/132  bone]
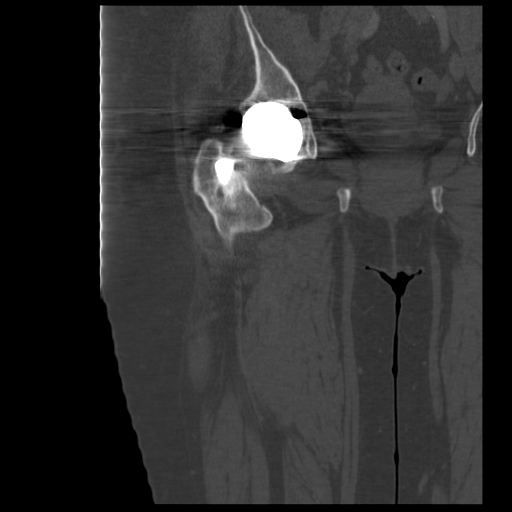

[Series 301: sag rt thigh w/ · sagittal · 0.74mm/px · 5 of 124 slices shown, 6 images]
[im 42/124  bone]
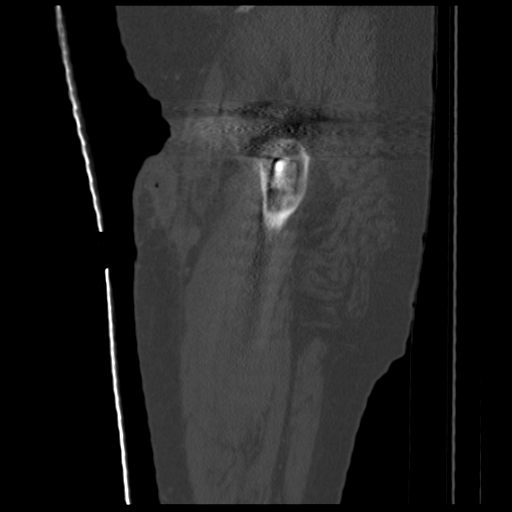
[im 52/124  bone]
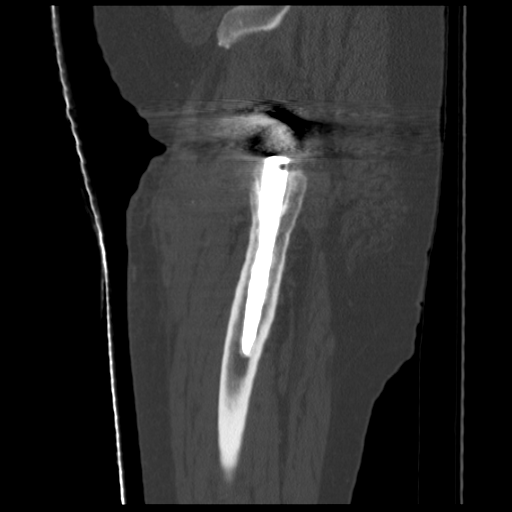
[im 62/124  soft-tissue]
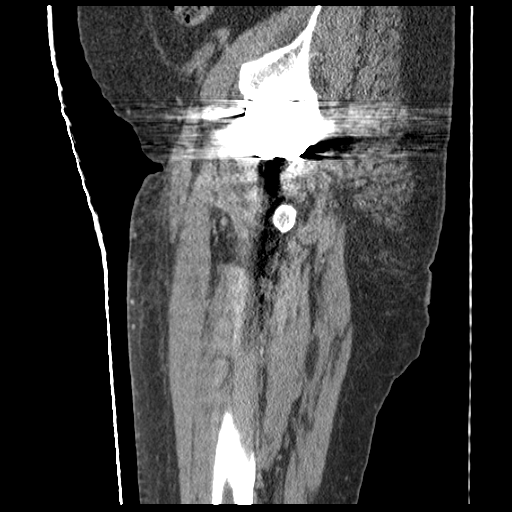
[im 62/124  bone]
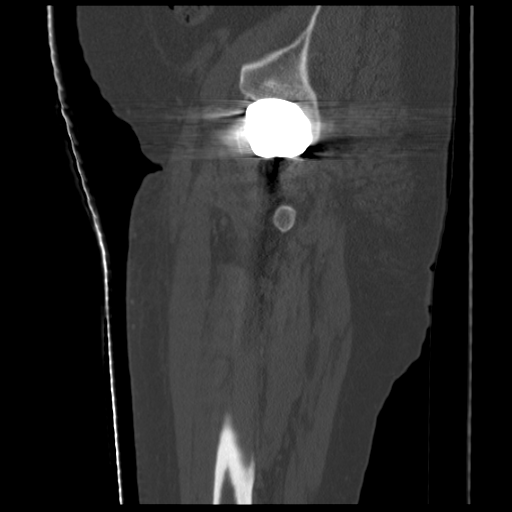
[im 72/124  bone]
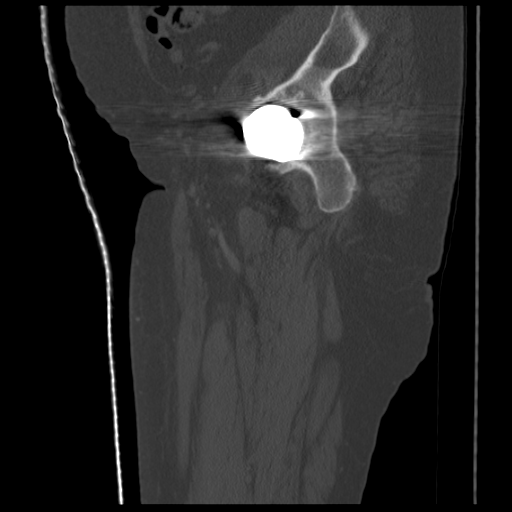
[im 83/124  bone]
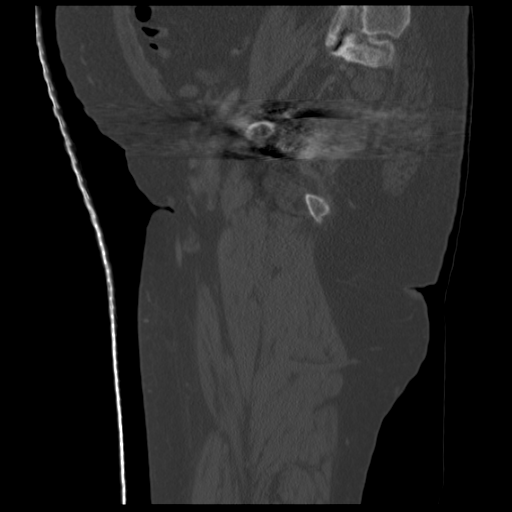

[15 of 33 positions shown; findings below may reference images not displayed]

FINDINGS: There is a persistent superficial fluid collection
within the anterior proximal right thigh, with overlying soft
tissue thickening, compatible with underlying cellulitis.  The
presumed abscess measures approximately 5.9 x 2.9 x 1.5 cm;
peripheral soft tissue thickening and enhancement are seen.  Foci
of free air within the abscess reflect recent attempted aspiration.

In addition, as noted on the prior MRI, poorly characterized fluid
is seen tracking across the rectus femoris into the vastus
intermedius, adjacent to the neck of the patient's right hip
prosthesis.  This raises concern for associated hip joint
infection, assuming that the superficial fluid collection
represents an abscess, although no significant joint effusion is
characterized.

Surrounding soft tissue stranding is appreciated; mild soft tissue
stranding is noted along the fascia of the anterior right thigh.
No additional fluid collections are identified.  There is no
evidence of vascular involvement.

The right hip prosthesis remains in expected alignment.  The uterus
has increased in size from the prior study, with a focal relatively
well defined mass measuring 7.2 cm at the fundus of the uterus.
Although this may reflect a fibroid, it has increased in size from
3771, though the patient is presumably post-menopausal.  This
raises concern for malignancy.  Clinical correlation and pelvic
ultrasound would be helpful.

The visualized portions of the small and large bowel are
unremarkable.  The appendix is normal in caliber, and contains air.
No definite osseous erosions are identified.  Evaluation of the
right hip is suboptimal due to metallic artifact.
IMPRESSION: 1.  Persistent superficial fluid collection within the anterior
proximal right thigh, with overlying soft tissue thickening;
presumed abscess measures approximately 5.9 x 2.9 x 1.5 cm.  Foci
of free air within the abscess reflect recent attempted aspiration.

2.  Poorly characterized fluid seen tracking across the rectus
femoris into the vastus intermedius, adjacent to the neck of the
patient's right hip prosthesis.  This raises concern for associated
hip joint infection, assuming that the superficial fluid collection
represents an abscess, although no significant joint effusion is
characterized.  No evidence of vascular involvement.
3.  Interval increase in size of the uterus, with a relatively well
defined 7.2 cm mass at the fundus of the uterus.  Although this may
reflect a fibroid, it has increased in size from 3771, though the
patient is presumably post-menopausal.  This raises concern for
malignancy.  Clinical correlation and pelvic ultrasound would be
helpful.

Findings were discussed with Dr. Son Pl at [DATE] p.m. on
[DATE].

## 2011-05-11 ENCOUNTER — Ambulatory Visit: Payer: Medicare Other | Admitting: Physical Therapy

## 2011-05-11 ENCOUNTER — Other Ambulatory Visit: Payer: Self-pay | Admitting: Neurological Surgery

## 2011-05-11 ENCOUNTER — Ambulatory Visit
Admission: RE | Admit: 2011-05-11 | Discharge: 2011-05-11 | Disposition: A | Discharge status: Home / Self Care | Payer: Medicare Other | Source: Ambulatory Visit | Attending: Neurological Surgery | Admitting: Neurological Surgery

## 2011-05-11 DIAGNOSIS — M4802 Spinal stenosis, cervical region: Secondary | ICD-10-CM

## 2011-05-11 DIAGNOSIS — M542 Cervicalgia: Secondary | ICD-10-CM

## 2011-05-11 DIAGNOSIS — M47812 Spondylosis without myelopathy or radiculopathy, cervical region: Secondary | ICD-10-CM

## 2011-05-11 NOTE — Op Note (Signed)
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  PROCEDURES: 1. Decompressive anterior cervical diskectomy, C4-5, C5-6. 2. Anterior cervical arthrodesis, C4-5, C5-6 utilizing 7-mm carbon     fiber cages packed with local autograft and Actifuse putty. 3. Anterior cervical plating C4-C6 utilizing the DePuy spine plate.  SURGEON:  Tia Alert, MD  ASSISTANT:  Donalee Citrin, MD  ANESTHESIA:  General endotracheal.  COMPLICATIONS:  None apparent.  INDICATIONS FOR PROCEDURE:  Ms. Jodi Howell is a 70 year old female who is referred with neck and bilateral shoulder pain.  She tried medical management including injection therapy for quite a long time without significant relief.  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  I recommended two- level ACDF with plating in hopes of improving her pain syndrome.  She understood the risks, benefits, and expected outcome and wished to proceed.  DESCRIPTION OF PROCEDURE:  The patient was taken to the operating room and after induction of adequate generalized endotracheal anesthesia, she was placed in supine position on the operating room table.  Her right anterior cervical region was prepped with DuraPrep and then draped in usual sterile fashion.  5 mL of local anesthesia was injected and a transverse incision was made to the right of midline and carried  down to the platysma, which was elevated, opened, and undermined with Metzenbaum scissors.  I then dissected a plane medial to the sternocleidomastoid muscle and internal carotid artery lateral to the trachea and esophagus to expose C4-5 and C5-6.  Intraoperative fluoroscopy confirmed my level and then I was able to take down the longus colli muscles and place the Shadow-Line retractors under this.  She had large overgrown anterior osteophytes that were removed with Leksell rongeur.  I then incised the disk space and performed the initial diskectomy with pituitary rongeurs and curved curettes.  I then used the high-speed drill to drill each endplate.  She had very collapsed disk space as I was able to widen these to 7 mm drilling away the endplates saving the drill shavings in a mucous trap for later arthrodesis.  I then drilled down to the level of the posterior spurs and posterior longitudinal ligament.  I brought in the operating microscope.  We started at C4-5 where the exact same decompression was done at both levels.  We opened the posterior longitudinal ligament with a nerve hook and then removed it undercutting the bodies of C4 and C5 and C5 and C6 by angling the scope up and down and looking up under the few bodies as best we could  while we marched along the endplates with a 1 and 2 mm Kerrison punch.  We performed bilateral foraminotomies.  We marched along the superior endplate of C5 and C6 and identified the pedicles, marched along the pedicles, identified the nerve roots, and decompressed the nerve roots distally into their respective foramina.  At C4-5, we were actually able to remove the superior part of the uncovertebral joint to further decompress the foramina.  Once the decompression was complete, we could palpate it with a nerve hook easily in a circumferential fashion.  The nerve hook passed easily along the nerve root.  We felt like we had a good decompression by both  visualization and by palpation.  We irrigated with saline solution, dried the surgical bed with Surgifoam and with Gelfoam, measured the interspace to be 7 mm and used corresponding 7-mm carbon fiber cages and packed these with local autograft and Actifuse putty and tapped these into position at C4-5 and C5-6.  We then used a 32-mm DePuy plate and placed two 12-mm variable angle screws in the bodies of C4, C5, and C6 and these were locked into the plate by locking mechanism.  We then irrigated with saline solution, dried all bleeding points with bipolar cautery with Surgifoam and once meticulous hemostasis was achieved, closed the platysma with 3-0 Vicryl closing subcuticular tissue with 3-0 Vicryl, closed the skin with Benzoin and Steri-Strips.  The drapes were removed.  Sterile dressing was applied. The patient was awakened from general anesthesia and transferred to the recovery room in a stable condition.  At the end of the procedure, all sponge, needle, and instrument counts were correct.     Tia Alert, MD     DSJ/MEDQ  D:  04/07/2011  T:  04/08/2011  Job:  [id number]  Electronically Signed by Marikay Alar MD on 05/11/2011 09:45:53 AM

## 2011-05-13 ENCOUNTER — Encounter: Payer: Federal, State, Local not specified - PPO | Admitting: Physical Therapy

## 2011-05-17 ENCOUNTER — Ambulatory Visit: Payer: Federal, State, Local not specified - PPO | Admitting: Physical Therapy

## 2011-05-19 ENCOUNTER — Ambulatory Visit: Payer: Medicare Other | Admitting: Physical Therapy

## 2011-05-25 ENCOUNTER — Ambulatory Visit: Payer: Medicare Other | Admitting: Physical Therapy

## 2011-05-27 ENCOUNTER — Ambulatory Visit: Payer: Medicare Other | Admitting: Physical Therapy

## 2011-06-01 ENCOUNTER — Ambulatory Visit: Payer: Medicare Other | Attending: Orthopedic Surgery | Admitting: Physical Therapy

## 2011-06-01 DIAGNOSIS — Z96649 Presence of unspecified artificial hip joint: Secondary | ICD-10-CM | POA: Insufficient documentation

## 2011-06-01 DIAGNOSIS — IMO0001 Reserved for inherently not codable concepts without codable children: Secondary | ICD-10-CM | POA: Insufficient documentation

## 2011-06-01 DIAGNOSIS — R269 Unspecified abnormalities of gait and mobility: Secondary | ICD-10-CM | POA: Insufficient documentation

## 2011-06-03 ENCOUNTER — Ambulatory Visit: Payer: Medicare Other | Admitting: Physical Therapy

## 2011-06-08 ENCOUNTER — Ambulatory Visit: Payer: Medicare Other | Admitting: Physical Therapy

## 2011-06-10 ENCOUNTER — Ambulatory Visit: Payer: Medicare Other | Admitting: Physical Therapy

## 2011-06-14 ENCOUNTER — Ambulatory Visit: Payer: Medicare Other | Admitting: Physical Therapy

## 2011-06-15 ENCOUNTER — Encounter: Payer: Medicare Other | Admitting: Physical Therapy

## 2011-06-16 ENCOUNTER — Ambulatory Visit: Payer: Medicare Other | Admitting: Physical Therapy

## 2011-06-21 ENCOUNTER — Ambulatory Visit: Payer: Medicare Other | Admitting: Physical Therapy

## 2011-07-01 ENCOUNTER — Ambulatory Visit: Payer: Medicare Other | Attending: Orthopedic Surgery | Admitting: Physical Therapy

## 2011-07-01 DIAGNOSIS — Z96649 Presence of unspecified artificial hip joint: Secondary | ICD-10-CM | POA: Insufficient documentation

## 2011-07-01 DIAGNOSIS — R269 Unspecified abnormalities of gait and mobility: Secondary | ICD-10-CM | POA: Insufficient documentation

## 2011-07-01 DIAGNOSIS — IMO0001 Reserved for inherently not codable concepts without codable children: Secondary | ICD-10-CM | POA: Insufficient documentation

## 2011-07-06 ENCOUNTER — Ambulatory Visit: Payer: Medicare Other | Admitting: Physical Therapy

## 2011-07-08 ENCOUNTER — Ambulatory Visit: Payer: Medicare Other | Admitting: Physical Therapy

## 2011-07-13 ENCOUNTER — Ambulatory Visit: Payer: Medicare Other | Admitting: Physical Therapy

## 2011-07-15 ENCOUNTER — Ambulatory Visit: Payer: Medicare Other | Admitting: Physical Therapy

## 2011-07-19 ENCOUNTER — Other Ambulatory Visit: Payer: Self-pay | Admitting: Gastroenterology

## 2011-07-19 DIAGNOSIS — R1084 Generalized abdominal pain: Secondary | ICD-10-CM

## 2011-07-22 ENCOUNTER — Ambulatory Visit
Admission: RE | Admit: 2011-07-22 | Discharge: 2011-07-22 | Disposition: A | Discharge status: Home / Self Care | Payer: Medicare Other | Source: Ambulatory Visit | Attending: Gastroenterology | Admitting: Gastroenterology

## 2011-07-22 DIAGNOSIS — R1084 Generalized abdominal pain: Secondary | ICD-10-CM

## 2011-07-22 MED ORDER — IOHEXOL 350 MG/ML SOLN: 100.0000 mL | Freq: Once | INTRAVENOUS | Status: AC | PRN

## 2011-07-27 ENCOUNTER — Ambulatory Visit: Payer: Medicare Other | Admitting: Physical Therapy

## 2011-07-29 ENCOUNTER — Ambulatory Visit: Payer: Medicare Other | Admitting: Physical Therapy

## 2011-08-03 ENCOUNTER — Ambulatory Visit: Payer: Medicare Other | Attending: Orthopedic Surgery | Admitting: Physical Therapy

## 2011-08-03 DIAGNOSIS — R269 Unspecified abnormalities of gait and mobility: Secondary | ICD-10-CM | POA: Insufficient documentation

## 2011-08-03 DIAGNOSIS — IMO0001 Reserved for inherently not codable concepts without codable children: Secondary | ICD-10-CM | POA: Insufficient documentation

## 2011-08-03 DIAGNOSIS — Z96649 Presence of unspecified artificial hip joint: Secondary | ICD-10-CM | POA: Insufficient documentation

## 2011-08-05 ENCOUNTER — Ambulatory Visit: Payer: Medicare Other | Admitting: Physical Therapy

## 2011-08-10 ENCOUNTER — Encounter: Payer: Medicare Other | Admitting: Physical Therapy

## 2011-08-12 ENCOUNTER — Ambulatory Visit: Payer: Medicare Other | Admitting: Physical Therapy

## 2011-08-13 IMAGING — US IR US GUIDE VASC ACCESS RIGHT
1 series · 1 of 1 positions shown · non-contrast
Comparison: None.

03/24/2010 - DUPLICATE COPY for exam association in [facility] – No change from original report.
CLINICAL DATA: Infected right total hip arthroplasty; central
 venous access is requested for antibiotic therapy.

 RIGHT UPPER EXTREMITY PICC PLACEMENT WITH ULTRASOUND AND FLUORO
 GUIDANCE
TECHNIQUE: The right arm was prepped with chlorhexidine, draped in
 the usual sterile fashion using maximum barrier technique and
 infiltrated locally with 1% Lidocaine. Ultrasound demonstrated
 patency of the right brachial vein, and this was documented with an
 image. Under real-time ultrasound guidance, this vein was accessed
 with a 21 gauge micropuncture needle and image documentation was
 performed. The needle was exchanged over a guidewire for a peel-
 away sheath through which a five French double lumen PICC trimmed
 to 41cm was advanced, positioned with its tip at the lower
 SVC/right atrial junction. Fluoroscopy during the procedure and
 fluoro spot radiograph confirms appropriate catheter position. The
 catheter was flushed, secured to the skin with Prolene sutures, and
 covered with a sterile dressing. No immediate complication.
 Fluoroscopy Time: 0.8 minutes.

[Series 1: sp us guide vasc access*right* · 1 of 1 slices shown]
[im 1/1]
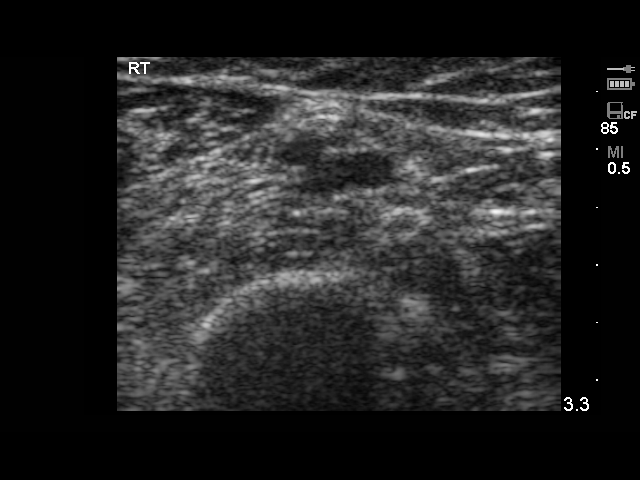

[1 of 1 positions shown; findings below may reference images not displayed]

IMPRESSION: Technically successful right arm PICC placement with ultrasound and
 fluoroscopic guidance. The catheter is ready for use.

 Read by: Benny, Anneke.-JESU

 ULTRASOUND VENOUS ACCESS
FINDINGS: 
IMPRESSION:

## 2011-08-16 ENCOUNTER — Other Ambulatory Visit: Payer: Self-pay | Admitting: Neurological Surgery

## 2011-08-16 ENCOUNTER — Ambulatory Visit
Admission: RE | Admit: 2011-08-16 | Discharge: 2011-08-16 | Disposition: A | Discharge status: Home / Self Care | Payer: Medicare Other | Source: Ambulatory Visit | Attending: Neurological Surgery | Admitting: Neurological Surgery

## 2011-08-16 DIAGNOSIS — M542 Cervicalgia: Secondary | ICD-10-CM

## 2011-08-17 ENCOUNTER — Encounter: Payer: Medicare Other | Admitting: Physical Therapy

## 2011-08-19 ENCOUNTER — Ambulatory Visit: Payer: Medicare Other | Admitting: Physical Therapy

## 2011-08-24 ENCOUNTER — Ambulatory Visit: Payer: Medicare Other | Admitting: Physical Therapy

## 2011-08-26 ENCOUNTER — Ambulatory Visit: Payer: Medicare Other | Admitting: Physical Therapy

## 2011-08-31 ENCOUNTER — Ambulatory Visit: Payer: Medicare Other | Attending: Orthopedic Surgery | Admitting: Physical Therapy

## 2011-08-31 DIAGNOSIS — R269 Unspecified abnormalities of gait and mobility: Secondary | ICD-10-CM | POA: Insufficient documentation

## 2011-08-31 DIAGNOSIS — IMO0001 Reserved for inherently not codable concepts without codable children: Secondary | ICD-10-CM | POA: Insufficient documentation

## 2011-08-31 DIAGNOSIS — Z96649 Presence of unspecified artificial hip joint: Secondary | ICD-10-CM | POA: Insufficient documentation

## 2011-09-07 ENCOUNTER — Ambulatory Visit: Payer: Medicare Other | Admitting: Physical Therapy

## 2011-11-02 ENCOUNTER — Ambulatory Visit
Admission: RE | Admit: 2011-11-02 | Discharge: 2011-11-02 | Disposition: A | Discharge status: Home / Self Care | Payer: Medicare Other | Source: Ambulatory Visit | Attending: Neurological Surgery | Admitting: Neurological Surgery

## 2011-11-02 ENCOUNTER — Other Ambulatory Visit: Payer: Self-pay | Admitting: Neurological Surgery

## 2011-11-02 DIAGNOSIS — M47812 Spondylosis without myelopathy or radiculopathy, cervical region: Secondary | ICD-10-CM

## 2011-11-02 DIAGNOSIS — M542 Cervicalgia: Secondary | ICD-10-CM

## 2011-11-30 HISTORY — PX: NECK SURGERY: SHX720

## 2012-08-28 ENCOUNTER — Other Ambulatory Visit: Payer: Self-pay | Admitting: Gastroenterology

## 2012-08-28 DIAGNOSIS — K219 Gastro-esophageal reflux disease without esophagitis: Secondary | ICD-10-CM

## 2012-08-30 ENCOUNTER — Ambulatory Visit
Admission: RE | Admit: 2012-08-30 | Discharge: 2012-08-30 | Disposition: A | Discharge status: Home / Self Care | Payer: Medicare Other | Source: Ambulatory Visit | Attending: Gastroenterology | Admitting: Gastroenterology

## 2012-08-30 DIAGNOSIS — K219 Gastro-esophageal reflux disease without esophagitis: Secondary | ICD-10-CM

## 2012-08-31 ENCOUNTER — Other Ambulatory Visit: Payer: Medicare Other

## 2012-09-04 ENCOUNTER — Encounter (HOSPITAL_COMMUNITY)
Admission: RE | Disposition: A | Discharge status: Hospice | Payer: Self-pay | Source: Ambulatory Visit | Attending: Gastroenterology

## 2012-09-04 ENCOUNTER — Ambulatory Visit (HOSPITAL_COMMUNITY)
Admission: RE | Admit: 2012-09-04 | Discharge: 2012-09-04 | Disposition: A | Discharge status: Hospice | Payer: Medicare Other | Source: Ambulatory Visit | Attending: Gastroenterology | Admitting: Gastroenterology

## 2012-09-04 DIAGNOSIS — K219 Gastro-esophageal reflux disease without esophagitis: Secondary | ICD-10-CM | POA: Insufficient documentation

## 2012-09-04 DIAGNOSIS — R1310 Dysphagia, unspecified: Secondary | ICD-10-CM | POA: Insufficient documentation

## 2012-09-04 DIAGNOSIS — R0789 Other chest pain: Secondary | ICD-10-CM | POA: Insufficient documentation

## 2012-09-04 HISTORY — PX: ESOPHAGEAL MANOMETRY: SHX5429

## 2012-09-04 SURGERY — MANOMETRY, ESOPHAGUS

## 2012-09-04 MED ORDER — LIDOCAINE VISCOUS 2 % MT SOLN
OROMUCOSAL | Status: AC
  Filled 2012-09-04: qty 15

## 2012-09-05 ENCOUNTER — Encounter (HOSPITAL_COMMUNITY): Payer: Self-pay | Admitting: Gastroenterology

## 2012-09-06 NOTE — H&P (Signed)
Subjective:   Patient is a 71 y.o. female presents with dysphagia for elective esophageal manometry. Procedure including risks and benefits discussed in office.  Patient Active Problem List   Diagnosis Date Noted  . HYPERTENSION 05/20/2010  . GERD 05/20/2010  . OSTEOARTHRITIS 05/20/2010  . OSTEOPENIA 05/20/2010  . INF&INFLAM REACTION DUE INTRL JOINT PROSTHESIS 03/20/2010   No past medical history on file.  Past Surgical History  Procedure Date  . Esophageal manometry 09/04/2012    Procedure: ESOPHAGEAL MANOMETRY (EM);  Surgeon: Vertell Novak., MD;  Location: [facility];  Service: Endoscopy;  Laterality: N/A;    No prescriptions prior to admission   Allergies not on file  History  Substance Use Topics  . Smoking status: Not on file  . Smokeless tobacco: Not on file  . Alcohol Use: Not on file    No family history on file.   Objective:   No data found.        See MD Preop evaluation      Assessment:   Dysphagia for esophageal manometry  Plan:   Esophageal manometry. Procedure discussed in office.

## 2012-09-25 ENCOUNTER — Other Ambulatory Visit: Payer: Self-pay | Admitting: Gynecology

## 2012-10-02 NOTE — Op Note (Signed)
Procedure: Esophageal Manometry Date of Procedure: 09/04/2012 Indication: Globus sensation and vague dysphagia with negative EGD  Description of Procedure: Procedure performed in usual manner at [facility] endoscopy   Results a follows  1. UES. Mean pressure 65.4 relaxes with swallowing normal  2. LES. Baseline pressure, 14.9 relaxes with swallowing residual 7.5 normal  3. Esophageal Body.  10 wet swallows performed Peristalsis normal in all swallows. Pressures normal  Impression: Normal Esophageal Manometry  Plan:   Follow-up in office

## 2012-11-29 HISTORY — PX: FRACTURE SURGERY: SHX138

## 2012-11-29 HISTORY — PX: PERIPHERALLY INSERTED CENTRAL CATHETER INSERTION: SHX2221

## 2013-02-27 DIAGNOSIS — N390 Urinary tract infection, site not specified: Secondary | ICD-10-CM

## 2013-02-27 HISTORY — DX: Urinary tract infection, site not specified: N39.0

## 2013-03-17 ENCOUNTER — Encounter (HOSPITAL_COMMUNITY): Payer: Self-pay | Admitting: *Deleted

## 2013-03-17 ENCOUNTER — Inpatient Hospital Stay (HOSPITAL_COMMUNITY)
Admission: EM | Admit: 2013-03-17 | Discharge: 2013-03-19 | DRG: 690 | Disposition: A | Discharge status: Home / Self Care | Payer: Medicare Other | Attending: Internal Medicine | Admitting: Internal Medicine

## 2013-03-17 ENCOUNTER — Emergency Department (HOSPITAL_COMMUNITY): Payer: Medicare Other

## 2013-03-17 DIAGNOSIS — I951 Orthostatic hypotension: Secondary | ICD-10-CM | POA: Diagnosis present

## 2013-03-17 DIAGNOSIS — Z7982 Long term (current) use of aspirin: Secondary | ICD-10-CM

## 2013-03-17 DIAGNOSIS — Z96649 Presence of unspecified artificial hip joint: Secondary | ICD-10-CM

## 2013-03-17 DIAGNOSIS — D696 Thrombocytopenia, unspecified: Secondary | ICD-10-CM | POA: Diagnosis present

## 2013-03-17 DIAGNOSIS — Z79899 Other long term (current) drug therapy: Secondary | ICD-10-CM

## 2013-03-17 DIAGNOSIS — F329 Major depressive disorder, single episode, unspecified: Secondary | ICD-10-CM | POA: Diagnosis present

## 2013-03-17 DIAGNOSIS — I10 Essential (primary) hypertension: Secondary | ICD-10-CM | POA: Diagnosis present

## 2013-03-17 DIAGNOSIS — N390 Urinary tract infection, site not specified: Secondary | ICD-10-CM | POA: Diagnosis present

## 2013-03-17 DIAGNOSIS — N12 Tubulo-interstitial nephritis, not specified as acute or chronic: Principal | ICD-10-CM

## 2013-03-17 DIAGNOSIS — R7309 Other abnormal glucose: Secondary | ICD-10-CM | POA: Diagnosis present

## 2013-03-17 DIAGNOSIS — R531 Weakness: Secondary | ICD-10-CM

## 2013-03-17 DIAGNOSIS — R110 Nausea: Secondary | ICD-10-CM | POA: Diagnosis present

## 2013-03-17 DIAGNOSIS — E860 Dehydration: Secondary | ICD-10-CM | POA: Diagnosis present

## 2013-03-17 DIAGNOSIS — K219 Gastro-esophageal reflux disease without esophagitis: Secondary | ICD-10-CM | POA: Diagnosis present

## 2013-03-17 DIAGNOSIS — F3289 Other specified depressive episodes: Secondary | ICD-10-CM | POA: Diagnosis present

## 2013-03-17 DIAGNOSIS — R509 Fever, unspecified: Secondary | ICD-10-CM

## 2013-03-17 HISTORY — DX: Essential (primary) hypertension: I10

## 2013-03-17 LAB — COMPREHENSIVE METABOLIC PANEL
Albumin: 3.3 g/dL — ABNORMAL LOW (ref 3.5–5.2)
Alkaline Phosphatase: 84 U/L (ref 39–117)
BUN: 16 mg/dL (ref 6–23)
CO2: 25 mEq/L (ref 19–32)
Chloride: 99 mEq/L (ref 96–112)
Creatinine, Ser: 0.87 mg/dL (ref 0.50–1.10)
GFR calc non Af Amer: 66 mL/min — ABNORMAL LOW (ref >90)
Potassium: 3.7 mEq/L (ref 3.5–5.1)
Total Bilirubin: 0.8 mg/dL (ref 0.3–1.2)

## 2013-03-17 LAB — CBC WITH DIFFERENTIAL/PLATELET
Basophils Relative: 0 % (ref 0–1)
HCT: 42.2 % (ref 36.0–46.0)
Hemoglobin: 15.1 g/dL — ABNORMAL HIGH (ref 12.0–15.0)
Lymphocytes Relative: 5 % — ABNORMAL LOW (ref 12–46)
Lymphs Abs: 0.4 10*3/uL — ABNORMAL LOW (ref 0.7–4.0)
MCHC: 35.8 g/dL (ref 30.0–36.0)
Monocytes Absolute: 0.6 10*3/uL (ref 0.1–1.0)
Monocytes Relative: 7 % (ref 3–12)
Neutro Abs: 7.7 10*3/uL (ref 1.7–7.7)
Neutrophils Relative %: 88 % — ABNORMAL HIGH (ref 43–77)
RBC: 4.63 MIL/uL (ref 3.87–5.11)
WBC: 8.8 10*3/uL (ref 4.0–10.5)

## 2013-03-17 LAB — URINALYSIS, ROUTINE W REFLEX MICROSCOPIC
Glucose, UA: NEGATIVE mg/dL
Ketones, ur: NEGATIVE mg/dL
Nitrite: NEGATIVE
Protein, ur: 100 mg/dL — AB
pH: 8 (ref 5.0–8.0)

## 2013-03-17 LAB — URINE MICROSCOPIC-ADD ON

## 2013-03-17 LAB — LACTIC ACID, PLASMA: Lactic Acid, Venous: 1 mmol/L (ref 0.5–2.2)

## 2013-03-17 LAB — CBC
HCT: 39.3 % (ref 36.0–46.0)
MCHC: 35.1 g/dL (ref 30.0–36.0)
Platelets: 117 10*3/uL — ABNORMAL LOW (ref 150–400)
RDW: 13.7 % (ref 11.5–15.5)
WBC: 6.6 10*3/uL (ref 4.0–10.5)

## 2013-03-17 LAB — CREATININE, SERUM
Creatinine, Ser: 0.89 mg/dL (ref 0.50–1.10)
GFR calc Af Amer: 75 mL/min — ABNORMAL LOW (ref >90)
GFR calc non Af Amer: 65 mL/min — ABNORMAL LOW (ref >90)

## 2013-03-17 MED ORDER — ACETAMINOPHEN 325 MG PO TABS
650.0000 mg | ORAL_TABLET | Freq: Four times a day (QID) | ORAL | Status: DC | PRN
  Administered 2013-03-17: 650 mg via ORAL
  Filled 2013-03-17: qty 2

## 2013-03-17 MED ORDER — ACETAMINOPHEN 650 MG RE SUPP: 650.0000 mg | Freq: Four times a day (QID) | RECTAL | Status: DC | PRN

## 2013-03-17 MED ORDER — SODIUM CHLORIDE 0.9 % IV BOLUS (SEPSIS)
1000.0000 mL | Freq: Once | INTRAVENOUS | Status: AC
  Administered 2013-03-17: 1000 mL via INTRAVENOUS

## 2013-03-17 MED ORDER — ACETAMINOPHEN 325 MG PO TABS
650.0000 mg | ORAL_TABLET | Freq: Once | ORAL | Status: AC
  Administered 2013-03-17: 650 mg via ORAL
  Filled 2013-03-17: qty 2

## 2013-03-17 MED ORDER — MORPHINE SULFATE 4 MG/ML IJ SOLN
4.0000 mg | Freq: Once | INTRAMUSCULAR | Status: AC
  Administered 2013-03-17: 4 mg via INTRAVENOUS
  Filled 2013-03-17: qty 1

## 2013-03-17 MED ORDER — TRAVOPROST (BAK FREE) 0.004 % OP SOLN
1.0000 [drp] | Freq: Every day | OPHTHALMIC | Status: DC
  Administered 2013-03-17 – 2013-03-18 (×2): 1 [drp] via OPHTHALMIC
  Filled 2013-03-17: qty 2.5

## 2013-03-17 MED ORDER — ONDANSETRON HCL 4 MG/2ML IJ SOLN: 4.0000 mg | Freq: Four times a day (QID) | INTRAMUSCULAR | Status: DC | PRN

## 2013-03-17 MED ORDER — ONDANSETRON HCL 4 MG PO TABS: 4.0000 mg | ORAL_TABLET | Freq: Four times a day (QID) | ORAL | Status: DC | PRN

## 2013-03-17 MED ORDER — ASPIRIN 81 MG PO TBEC
81.0000 mg | DELAYED_RELEASE_TABLET | Freq: Every day | ORAL | Status: DC
  Administered 2013-03-18 – 2013-03-19 (×2): 81 mg via ORAL
  Filled 2013-03-17 (×2): qty 1

## 2013-03-17 MED ORDER — HEPARIN SODIUM (PORCINE) 5000 UNIT/ML IJ SOLN
5000.0000 [IU] | Freq: Three times a day (TID) | INTRAMUSCULAR | Status: DC
  Administered 2013-03-17 – 2013-03-19 (×5): 5000 [IU] via SUBCUTANEOUS
  Filled 2013-03-17 (×8): qty 1

## 2013-03-17 MED ORDER — PANTOPRAZOLE SODIUM 40 MG PO TBEC
40.0000 mg | DELAYED_RELEASE_TABLET | Freq: Every day | ORAL | Status: DC
  Administered 2013-03-18 – 2013-03-19 (×2): 40 mg via ORAL
  Filled 2013-03-17 (×2): qty 1

## 2013-03-17 MED ORDER — TRAVOPROST 0.004 % OP SOLN: 1.0000 [drp] | Freq: Every day | OPHTHALMIC | Status: DC

## 2013-03-17 MED ORDER — SODIUM CHLORIDE 0.9 % IV SOLN
INTRAVENOUS | Status: AC
  Administered 2013-03-17: 20:00:00 via INTRAVENOUS

## 2013-03-17 MED ORDER — SODIUM CHLORIDE 0.9 % IV BOLUS (SEPSIS): 500.0000 mL | Freq: Once | INTRAVENOUS | Status: DC

## 2013-03-17 MED ORDER — IBUPROFEN 800 MG PO TABS
400.0000 mg | ORAL_TABLET | Freq: Four times a day (QID) | ORAL | Status: DC | PRN
  Administered 2013-03-18 – 2013-03-19 (×4): 400 mg via ORAL
  Filled 2013-03-17: qty 1
  Filled 2013-03-17: qty 0.5
  Filled 2013-03-17: qty 1
  Filled 2013-03-17: qty 0.5

## 2013-03-17 MED ORDER — ONDANSETRON HCL 4 MG/2ML IJ SOLN
4.0000 mg | Freq: Once | INTRAMUSCULAR | Status: AC
  Administered 2013-03-17: 4 mg via INTRAVENOUS
  Filled 2013-03-17: qty 2

## 2013-03-17 MED ORDER — ESCITALOPRAM OXALATE 20 MG PO TABS
20.0000 mg | ORAL_TABLET | Freq: Every day | ORAL | Status: DC
  Administered 2013-03-18 – 2013-03-19 (×2): 20 mg via ORAL
  Filled 2013-03-17 (×2): qty 1

## 2013-03-17 MED ORDER — SODIUM CHLORIDE 0.9 % IV BOLUS (SEPSIS)
500.0000 mL | Freq: Once | INTRAVENOUS | Status: AC
  Administered 2013-03-17: 1000 mL via INTRAVENOUS

## 2013-03-17 MED ORDER — HYDROCODONE-ACETAMINOPHEN 5-325 MG PO TABS
1.0000 | ORAL_TABLET | ORAL | Status: DC | PRN
  Administered 2013-03-18: 1 via ORAL
  Filled 2013-03-17: qty 1

## 2013-03-17 MED ORDER — DEXTROSE 5 % IV SOLN
1.0000 g | INTRAVENOUS | Status: DC
  Administered 2013-03-18: 1 g via INTRAVENOUS
  Filled 2013-03-17 (×3): qty 10

## 2013-03-17 MED ORDER — DEXTROSE 5 % IV SOLN
1.0000 g | INTRAVENOUS | Status: DC
  Administered 2013-03-17: 1 g via INTRAVENOUS
  Filled 2013-03-17: qty 10

## 2013-03-17 NOTE — ED Notes (Signed)
Pt reports recently being diagnosed and treated for UTI and yeast infection. Now having chills and headache x 2 days and pt thinks she has the flu. No acute distress noted at this time.

## 2013-03-17 NOTE — ED Provider Notes (Signed)
History     CSN: [id number]  Arrival date & time 03/17/13  1322   First MD Initiated Contact with Patient 03/17/13 1423      Chief complaint: fever, recent dx uti.    (Consider location/radiation/quality/duration/timing/severity/associated sxs/prior treatment) Patient is a 72 y.o. female presenting with urinary tract infection and fever. The history is provided by the patient and the spouse.  Urinary Tract Infection Pertinent negatives include no chest pain, no abdominal pain and no shortness of breath.  Fever Associated symptoms: dysuria   Associated symptoms: no chest pain, no confusion, no congestion, no cough, no diarrhea, no rash, no rhinorrhea, no sore throat and no vomiting   pt states went to ob/gyn md a couple days ago w urinary urgency, dyuria, ?flank pain, and was dx w uti. Was started on macrobid, and given fluconazole to prevent yeast infxn.  States was nauseated, didn't feel well, so in past day they changed abx to cefuroxime, took 1 dose. States fevers to 103 in ed. No sweats. Had gradual onset mild frontal headache earlier,  C/w prior headaches. No neck pain or stiffness. Denies cough, sinus drainage or pressure or  Other uri c/o. No abd pain. Nausea. No vomiting or diarrhea. Mild left flank pain and feels achy all over. No rash. No known ill contacts.     Past Medical History  Diagnosis Date  . UTI (urinary tract infection)   . Hypertension     Past Surgical History  Procedure Laterality Date  . Esophageal manometry  09/04/2012    Procedure: ESOPHAGEAL MANOMETRY (EM);  Surgeon: Vertell Novak., MD;  Location: [facility];  Service: Endoscopy;  Laterality: N/A;  . Total hip arthroplasty      History reviewed. No pertinent family history.  History  Substance Use Topics  . Smoking status: Not on file  . Smokeless tobacco: Not on file  . Alcohol Use: No    OB History   Grav Para Term Preterm Abortions TAB SAB Ect Mult Living                  Review  of Systems  Constitutional: Positive for fever.  HENT: Negative for congestion, sore throat, rhinorrhea, neck pain and neck stiffness.   Eyes: Negative for redness.  Respiratory: Negative for cough and shortness of breath.   Cardiovascular: Negative for chest pain.  Gastrointestinal: Negative for vomiting, abdominal pain, diarrhea and constipation.  Genitourinary: Positive for dysuria.  Musculoskeletal: Negative for back pain.  Skin: Negative for rash.  Neurological: Negative for syncope, weakness and numbness.  Hematological: Does not bruise/bleed easily.  Psychiatric/Behavioral: Negative for confusion.    Allergies  Review of patient's allergies indicates no known allergies.  Home Medications   Current Outpatient Rx  Name  Route  Sig  Dispense  Refill  . amLODipine-benazepril (LOTREL) 10-20 MG per capsule   Oral   Take 1 capsule by mouth daily.           Marland Kitchen aspirin 81 MG EC tablet   Oral   Take 81 mg by mouth daily.           Marland Kitchen escitalopram (LEXAPRO) 20 MG tablet   Oral   Take 20 mg by mouth daily.         Maxwell Caul Bicarbonate (ZEGERID) 20-1100 MG CAPS   Oral   Take 1 capsule by mouth daily before breakfast.         . travoprost, benzalkonium, (TRAVATAN) 0.004 % ophthalmic solution  Both Eyes   Place 1 drop into both eyes at bedtime.             BP 143/52  Pulse 92  Temp(Src) 103.4 F (39.7 C) (Rectal)  Resp 15  SpO2 95%  Physical Exam  Nursing note and vitals reviewed. Constitutional: She is oriented to person, place, and time. She appears well-developed and well-nourished. No distress.  HENT:  Mouth/Throat: Oropharynx is clear and moist.  No sinus or temporal tenderness  Eyes: Conjunctivae and EOM are normal. Pupils are equal, round, and reactive to light. No scleral icterus.  Neck: Normal range of motion. Neck supple. No tracheal deviation present. No thyromegaly present.  No stiffness or rigidity  Cardiovascular: Normal rate,  regular rhythm, normal heart sounds and intact distal pulses.   No murmur heard. Pulmonary/Chest: Effort normal and breath sounds normal. No respiratory distress.  Abdominal: Soft. Normal appearance and bowel sounds are normal. She exhibits no distension and no mass. There is no tenderness. There is no rebound and no guarding.  Genitourinary:  ?mild left cva tenderness  Musculoskeletal: She exhibits no edema and no tenderness.  Neurological: She is alert and oriented to person, place, and time.  Motor intact bil.   Skin: Skin is warm and dry. No rash noted. She is not diaphoretic.  Psychiatric: She has a normal mood and affect.    ED Course  Procedures (including critical care time)  Results for orders placed during the hospital encounter of 03/17/13  CBC WITH DIFFERENTIAL      Result Value Range   WBC 8.8  4.0 - 10.5 K/uL   RBC 4.63  3.87 - 5.11 MIL/uL   Hemoglobin 15.1 (*) 12.0 - 15.0 g/dL   HCT 16.1  09.6 - 04.5 %   MCV 91.1  78.0 - 100.0 fL   MCH 32.6  26.0 - 34.0 pg   MCHC 35.8  30.0 - 36.0 g/dL   RDW 40.9  81.1 - 91.4 %   Platelets 133 (*) 150 - 400 K/uL   Neutrophils Relative 88 (*) 43 - 77 %   Neutro Abs 7.7  1.7 - 7.7 K/uL   Lymphocytes Relative 5 (*) 12 - 46 %   Lymphs Abs 0.4 (*) 0.7 - 4.0 K/uL   Monocytes Relative 7  3 - 12 %   Monocytes Absolute 0.6  0.1 - 1.0 K/uL   Eosinophils Relative 0  0 - 5 %   Eosinophils Absolute 0.0  0.0 - 0.7 K/uL   Basophils Relative 0  0 - 1 %   Basophils Absolute 0.0  0.0 - 0.1 K/uL  COMPREHENSIVE METABOLIC PANEL      Result Value Range   Sodium 135  135 - 145 mEq/L   Potassium 3.7  3.5 - 5.1 mEq/L   Chloride 99  96 - 112 mEq/L   CO2 25  19 - 32 mEq/L   Glucose, Bld 125 (*) 70 - 99 mg/dL   BUN 16  6 - 23 mg/dL   Creatinine, Ser 7.82  0.50 - 1.10 mg/dL   Calcium 9.1  8.4 - 95.6 mg/dL   Total Protein 6.5  6.0 - 8.3 g/dL   Albumin 3.3 (*) 3.5 - 5.2 g/dL   AST 27  0 - 37 U/L   ALT 16  0 - 35 U/L   Alkaline Phosphatase 84  39 -  117 U/L   Total Bilirubin 0.8  0.3 - 1.2 mg/dL   GFR calc non Af Amer 66 (*) >  90 mL/min   GFR calc Af Amer 77 (*) >90 mL/min  LACTIC ACID, PLASMA      Result Value Range   Lactic Acid, Venous 1.0  0.5 - 2.2 mmol/L  URINALYSIS, ROUTINE W REFLEX MICROSCOPIC      Result Value Range   Color, Urine YELLOW  YELLOW   APPearance CLOUDY (*) CLEAR   Specific Gravity, Urine 1.020  1.005 - 1.030   pH 8.0  5.0 - 8.0   Glucose, UA NEGATIVE  NEGATIVE mg/dL   Hgb urine dipstick SMALL (*) NEGATIVE   Bilirubin Urine SMALL (*) NEGATIVE   Ketones, ur NEGATIVE  NEGATIVE mg/dL   Protein, ur 119 (*) NEGATIVE mg/dL   Urobilinogen, UA 1.0  0.0 - 1.0 mg/dL   Nitrite NEGATIVE  NEGATIVE   Leukocytes, UA MODERATE (*) NEGATIVE  URINE MICROSCOPIC-ADD ON      Result Value Range   Squamous Epithelial / LPF MANY (*) RARE   WBC, UA 7-10  <3 WBC/hpf   RBC / HPF 0-2  <3 RBC/hpf   Bacteria, UA FEW (*) RARE   Dg Chest 2 View  03/17/2013  *RADIOLOGY REPORT*  Clinical Data: Fever, shortness of breath  CHEST - 2 VIEW  Comparison: 07/12/2012  Findings: Bronchitic changes.  No focal consolidation. No pleural effusion or pneumothorax.  The heart is normal in size.  Mild degenerative changes of the visualized thoracolumbar spine. Cervical spine fixation hardware.  Cholecystectomy clips.  IMPRESSION: No evidence of acute cardiopulmonary disease.   Original Report Authenticated By: Charline Bills, M.D.        MDM  Iv ns. Labs. Ua.  Reviewed nursing notes and prior charts for additional history.   Tylenol po in triage for fever.   Additional ns iv. Morphine iv. zofran iv.   Pt w ?partially txd uti/pyelo on labs,  Will culture urine.   As failed outpt rx uti, will admit for ivf, and iv abx.   Discussed w [facility], states admit, med surg, team 5.         Suzi Roots, MD 03/17/13 [phone]

## 2013-03-17 NOTE — ED Notes (Signed)
Urine sample collected.  Bed side-toilet placed in the room.

## 2013-03-17 NOTE — H&P (Signed)
[facility] History and Physical  APPOLONIA ACKERT ZOX:[id number] DOB: [birth year] DOA: 03/17/2013  Referring physician: Dr. Meredith Mody the PCP: No primary provider on file.  Specialists:   Chief Complaint: fevers  HPI: Jodi Howell is a 72 y.o. female  Past medical history of multiple hip replacement that comes in for fever. She relates she started having fevers and headache 3 days prior to admission. She went to see her gynecologist who started her on Macrobid, after today she saw no improvement so she called her primary care doctor who started her on cefuroxime. Her fevers persisted along with nausea and vomiting so she came to the emergency room.  She relates her fevers have been getting worse, she also relates dizziness upon standing. Some nausea not able to tolerate oral fluids as a cause of the more nauseated. She continues to take her blood pressure medication. She relates no suprapubic tenderness.  In the emergency room:  In the emergency room she spiking fevers 103, her CBC shows a white count of 8.8 with a differential of 7.7 her kidney function is at baseline, and her hemoglobin is 15. We were asked to admit and further evaluate.  Review of Systems: The patient denies anorexia,  weight loss,, vision loss, decreased hearing, hoarseness, chest pain, syncope, dyspnea on exertion, peripheral edema,, hemoptysis, abdominal pain, melena, hematochezia, severe indigestion/heartburn, hematuria, incontinence, genital sores, muscle weakness, suspicious skin lesions, transient blindness, difficulty walking, depression, unusual weight change, abnormal bleeding, enlarged lymph nodes, angioedema, and breast masses.    Past Medical History  Diagnosis Date  . UTI (urinary tract infection)   . Hypertension    Past Surgical History  Procedure Laterality Date  . Esophageal manometry  09/04/2012    Procedure: ESOPHAGEAL MANOMETRY (EM);  Surgeon: Vertell Novak., MD;  Location: [facility];  Service: Endoscopy;  Laterality: N/A;  . Total hip arthroplasty     Social History:  reports that she has never smoked. She does not have any smokeless tobacco history on file. She reports that she drinks about 1.2 ounces of alcohol per week. She reports that she does not use illicit drugs. At home with husband can perform all her ADLs  No Known Allergies  Family History  Problem Relation Age of Onset  . Heart attack Mother   . Diabetes Mellitus II Mother     Prior to Admission medications   Medication Sig Start Date End Date Taking? Authorizing Provider  amLODipine-benazepril (LOTREL) 10-20 MG per capsule Take 1 capsule by mouth daily.     Yes Historical Provider, MD  aspirin 81 MG EC tablet Take 81 mg by mouth daily.     Yes Historical Provider, MD  escitalopram (LEXAPRO) 20 MG tablet Take 20 mg by mouth daily.   Yes Historical Provider, MD  Omeprazole-Sodium Bicarbonate (ZEGERID) 20-1100 MG CAPS Take 1 capsule by mouth daily before breakfast.   Yes Historical Provider, MD  travoprost, benzalkonium, (TRAVATAN) 0.004 % ophthalmic solution Place 1 drop into both eyes at bedtime.     Yes Historical Provider, MD   Physical Exam: Filed Vitals:   03/17/13 1600 03/17/13 1601 03/17/13 1615 03/17/13 1630  BP: 145/64 145/64 138/57 124/50  Pulse:  85 86 82  Temp:  100.4 F (38 C)    TempSrc:  Oral    Resp:  16    SpO2:  94% 94% 90%    BP 124/50  Pulse 82  Temp(Src) 100.4 F (38 C) (Oral)  Resp 16  SpO2  90%  General Appearance:    Alert, cooperative, no distress, appears stated age  Head:    Normocephalic, without obvious abnormality, atraumatic           Throat:   Lips, mucosa, and tongue dry  Neck:   Supple, symmetrical, trachea midline, no adenopathy;    thyroid:  no enlargement/tenderness/nodules; no carotid   bruit or JVD  Back:     Symmetric, no curvature, ROM normal, no CVA tenderness  Lungs:     Clear to auscultation bilaterally, respirations unlabored   Chest Wall:    No tenderness or deformity   Heart:    Regular rate and rhythm, S1 and S2 normal, no murmur, rub   or gallop     Abdomen:     Soft, non-tender, bowel sounds active all four quadrants,    no masses, no organomegaly, no CVA tenderness        Extremities:   Extremities normal, atraumatic, no cyanosis or edema  Pulses:   2+ and symmetric all extremities  Skin:   Skin color, texture, turgor normal, no rashes or lesions  Lymph nodes:   Cervical, supraclavicular, and axillary nodes normal  Neurologic:   CNII-XII intact, normal strength, sensation and reflexes    throughout    Labs on Admission:  Basic Metabolic Panel:  Recent Labs Lab 03/17/13 1512  NA 135  K 3.7  CL 99  CO2 25  GLUCOSE 125*  BUN 16  CREATININE 0.87  CALCIUM 9.1   Liver Function Tests:  Recent Labs Lab 03/17/13 1512  AST 27  ALT 16  ALKPHOS 84  BILITOT 0.8  PROT 6.5  ALBUMIN 3.3*   No results found for this basename: LIPASE, AMYLASE,  in the last 168 hours No results found for this basename: AMMONIA,  in the last 168 hours CBC:  Recent Labs Lab 03/17/13 1512  WBC 8.8  NEUTROABS 7.7  HGB 15.1*  HCT 42.2  MCV 91.1  PLT 133*   Cardiac Enzymes: No results found for this basename: CKTOTAL, CKMB, CKMBINDEX, TROPONINI,  in the last 168 hours  BNP (last 3 results) No results found for this basename: PROBNP,  in the last 8760 hours CBG: No results found for this basename: GLUCAP,  in the last 168 hours  Radiological Exams on Admission: Dg Chest 2 View  03/17/2013  *RADIOLOGY REPORT*  Clinical Data: Fever, shortness of breath  CHEST - 2 VIEW  Comparison: 07/12/2012  Findings: Bronchitic changes.  No focal consolidation. No pleural effusion or pneumothorax.  The heart is normal in size.  Mild degenerative changes of the visualized thoracolumbar spine. Cervical spine fixation hardware.  Cholecystectomy clips.  IMPRESSION: No evidence of acute cardiopulmonary disease.   Original Report  Authenticated By: Charline Bills, M.D.     EKG: Independently reviewed. none  Assessment/Plan  UTI (lower urinary tract infection)/Nausea : - And agree with the ED physician bring her into the hospital as she has failed outpatient treatment for urinary tract infection. She seems to be nauseated and is not improving. Her mood continuing IV Rocephin, blood cultures and urine cultures are ready drawn. - Zofran for nausea  Orthostatic hypotension/ Dehydration: - She clearly relates dizziness upon standing, 1 check orthostatics. I agree with continuing IV fluid hydration. I've encouraged the patient to drink plenty of water. Hold All her blood pressure medication and repeat orthostatics in the morning.  HYPERTENSION: - She is orthostatic, I will have her blood pressure medication recheck orthostasis in the morning. He  can resume her blood pressure medication as her blood pressure starts trending up.  GERD: - Continue PPI.  Code Status: full Family Communication: husband at bedside Disposition Plan: inpatient 2-3 days  Time spent: 75 minutes  Marinda Elk [facility] Pager [phone]  If 7PM-7AM, please contact night-coverage [email or web address] Password [facility] 03/17/2013, 4:59 PM

## 2013-03-17 NOTE — ED Notes (Signed)
Reported elevated oral temp 102.5 to RN Christal

## 2013-03-18 DIAGNOSIS — N12 Tubulo-interstitial nephritis, not specified as acute or chronic: Principal | ICD-10-CM

## 2013-03-18 LAB — URINE CULTURE: Colony Count: 45000

## 2013-03-18 NOTE — Progress Notes (Signed)
PROGRESS NOTE  Jodi Howell HYQ:[id number] DOB: 05-16-1941 DOA: 03/17/2013 PCP: Katy Apo, MD  Brief narrative: 72 yr old female admitted 4.19.14  with failed out-patient therapy for UTI  Past medical history-As per Problem list Chart reviewed as below- H/o dysphagia followed by Dr. Randa Evens Re-implantation R thr 06/17/10- Had R Hip resection arthroplasty  03/20/10 Initial hip surgery performed on [place] in 2005 Admission 12/22/09 for R thigh abcess cholecystectomy 6.15.10  Consultants:  none  Procedures:  none  Antibiotics:  Ceftriaxone 4.19   Subjective  Feels fair.  denies SOB CP.  States fever better.  Appetite good.  Concerned about her hip Being infected by UTI.  Husband in room   Objective    Interim History: nad  Telemetry: nad  Objective: Filed Vitals:   03/17/13 2136 03/18/13 0543 03/18/13 0544 03/18/13 0546  BP: 111/49 120/54 121/51 132/55  Pulse: 74 79 81 75  Temp: 98.7 F (37.1 C)   98.3 F (36.8 C)  TempSrc: Oral   Oral  Resp: 18 20 18 18   Height:      Weight:      SpO2:  95% 95% 93%    Intake/Output Summary (Last 24 hours) at 03/18/13 0819 Last data filed at 03/18/13 0600  Gross per 24 hour  Intake   1112 ml  Output      0 ml  Net   1112 ml    Exam:  General: alert pleasant oriented Cardiovascular:  s1 s2 no m/r/g Respiratory:  clear Abdomen:  soft   Data Reviewed: Basic Metabolic Panel:  Recent Labs Lab 03/17/13 1512 03/17/13 1851  NA 135  --   K 3.7  --   CL 99  --   CO2 25  --   GLUCOSE 125*  --   BUN 16  --   CREATININE 0.87 0.89  CALCIUM 9.1  --    Liver Function Tests:  Recent Labs Lab 03/17/13 1512  AST 27  ALT 16  ALKPHOS 84  BILITOT 0.8  PROT 6.5  ALBUMIN 3.3*   No results found for this basename: LIPASE, AMYLASE,  in the last 168 hours No results found for this basename: AMMONIA,  in the last 168 hours CBC:  Recent Labs Lab 03/17/13 1512 03/17/13 1851  WBC 8.8 6.6   NEUTROABS 7.7  --   HGB 15.1* 13.8  HCT 42.2 39.3  MCV 91.1 91.4  PLT 133* 117*   Cardiac Enzymes: No results found for this basename: CKTOTAL, CKMB, CKMBINDEX, TROPONINI,  in the last 168 hours BNP: No components found with this basename: POCBNP,  CBG: No results found for this basename: GLUCAP,  in the last 168 hours  No results found for this or any previous visit (from the past 240 hour(s)).   Studies:              All Imaging reviewed and is as per above notation   Scheduled Meds: . sodium chloride   Intravenous STAT  . aspirin  81 mg Oral Daily  . cefTRIAXone (ROCEPHIN)  IV  1 g Intravenous Q24H  . escitalopram  20 mg Oral Daily  . heparin  5,000 Units Subcutaneous Q8H  . pantoprazole  40 mg Oral Daily  . Travoprost (BAK Free)  1 drop Both Eyes QHS   Continuous Infusions:    Assessment/Plan: 1. Pyelonephritis-Continue Rocephin 1 gm daily.  CBC in am.  Await urine/blood culture to guide therapy. Continue IVF 100 cc/hr 2. H/o depression-continue Escitalopram 20  daily 3. Prior h/o complicated Hip infection-currently stable 4. H/o Dysphagia-stable 5. Elevated blood sugar.  Check a1c in am  Code Status: Full Family Communication:  Husband updated at bedside Disposition Plan: Tranfers to Dr. Nehemiah Settle service 4.21.14 am-LM on answering service   Pleas Koch, MD  [facility] Pager [phone] 03/18/2013, 8:19 AM    LOS: 1 day

## 2013-03-18 NOTE — Progress Notes (Signed)
Pt voided 100 cc yellow urine.  Bladder scanned for 13 cc.

## 2013-03-19 LAB — CBC
MCH: 32.2 pg (ref 26.0–34.0)
MCHC: 35.1 g/dL (ref 30.0–36.0)
Platelets: 116 10*3/uL — ABNORMAL LOW (ref 150–400)
RDW: 13.6 % (ref 11.5–15.5)

## 2013-03-19 LAB — BASIC METABOLIC PANEL
Calcium: 8.9 mg/dL (ref 8.4–10.5)
GFR calc Af Amer: 83 mL/min — ABNORMAL LOW (ref >90)
GFR calc non Af Amer: 71 mL/min — ABNORMAL LOW (ref >90)
Potassium: 3.8 mEq/L (ref 3.5–5.1)
Sodium: 139 mEq/L (ref 135–145)

## 2013-03-19 LAB — HEMOGLOBIN A1C: Mean Plasma Glucose: 103 mg/dL (ref <117)

## 2013-03-19 MED ORDER — CIPROFLOXACIN HCL 500 MG PO TABS: 500.0000 mg | ORAL_TABLET | Freq: Two times a day (BID) | ORAL | Status: AC

## 2013-03-19 NOTE — Discharge Summary (Signed)
Physician Discharge Summary  Patient ID: Jodi Howell MRN: [id number] DOB/AGE: 02-18-1941 72 y.o.  Admit date: 03/17/2013 Discharge date: 03/19/2013  Admission Diagnoses:  Discharge Diagnoses:  Active Problems:   HYPERTENSION   GERD   UTI (lower urinary tract infection)   Dehydration   Nausea   Orthostatic hypotension   Discharged Condition: stable  Hospital Course:  The patient presented to the hospital for evaluation with complaint of fever she recently was treated for a UTI and yeast vaginitis by her GYN. She initially was started on nitrofurantoin, ultimately changed to cefuroxime.  however she had nausea vomiting and also some headache. She presented to the hospital for evaluation, UA was abnormal. Admission was deemed necessary for further evaluation and treatment of failed outpatient treatment of a UTI. Patient was given IV fluids, IV Rocephin. She remained afebrile, she's currently without leukocytosis, urine culture without definitive organism, this could be the result of previous antibiotics on an outpatient basis. Labs from gynecology office were reviewed and only shows a urine dipstick was obtained, no culture. At this time patient is without fever chills nausea vomiting she is without any other symptoms. We will treat this is failed treatment of outpatient UTI, probable pyelonephritis. She will continue a one week course of Cipro 500 mg b.i.d. Patient has mild thrombocytopenia which is consistent with her previous labs. She had an A1c ordered which was normal at 5.2  Consults:    Significant Diagnostic Studies:Dg Chest 2 View  03/17/2013  *RADIOLOGY REPORT*  Clinical Data: Fever, shortness of breath  CHEST - 2 VIEW  Comparison: 07/12/2012  Findings: Bronchitic changes.  No focal consolidation. No pleural effusion or pneumothorax.  The heart is normal in size.  Mild degenerative changes of the visualized thoracolumbar spine. Cervical spine fixation hardware.   Cholecystectomy clips.  IMPRESSION: No evidence of acute cardiopulmonary disease.   Original Report Authenticated By: Charline Bills, M.D.       Discharge Exam: Blood pressure 153/72, pulse 69, temperature 97.9 F (36.6 C), temperature source Oral, resp. rate 16, height 5\' 5"  (1.651 m), weight 90.7 kg (199 lb 15.3 oz), SpO2 96.00%. General appearance: alert and cooperative Resp: clear to auscultation bilaterally Cardio: regular rate and rhythm, S1, S2 normal, no murmur, click, rub or gallop GI: soft, non-tender; bowel sounds normal; no masses,  no organomegaly Extremities: extremities normal, atraumatic, no cyanosis or edema  Disposition:    Home     Medication List    TAKE these medications       amLODipine-benazepril 10-20 MG per capsule  Commonly known as:  LOTREL  Take 1 capsule by mouth daily.     aspirin 81 MG EC tablet  Take 81 mg by mouth daily.     ciprofloxacin 500 MG tablet  Commonly known as:  CIPRO  Take 1 tablet (500 mg total) by mouth 2 (two) times daily.     escitalopram 20 MG tablet  Commonly known as:  LEXAPRO  Take 20 mg by mouth daily.     Omeprazole-Sodium Bicarbonate 20-1100 MG Caps  Commonly known as:  ZEGERID  Take 1 capsule by mouth daily before breakfast.     travoprost (benzalkonium) 0.004 % ophthalmic solution  Commonly known as:  TRAVATAN  Place 1 drop into both eyes at bedtime.         SignedRenford Dills D 03/19/2013, 1:07 PM

## 2013-03-19 NOTE — Progress Notes (Signed)
Discharge instructions/Med Rec Sheet reviewed w/ pt. Pt expressed understanding and copies given w/ prescriptions. Pt d/c'd in stable condition via w/c, accompanied by discharge volunteers 

## 2013-04-29 DIAGNOSIS — J189 Pneumonia, unspecified organism: Secondary | ICD-10-CM

## 2013-04-29 HISTORY — DX: Pneumonia, unspecified organism: J18.9

## 2013-05-07 ENCOUNTER — Encounter (HOSPITAL_COMMUNITY): Payer: Self-pay | Admitting: Emergency Medicine

## 2013-05-07 DIAGNOSIS — Z9089 Acquired absence of other organs: Secondary | ICD-10-CM | POA: Insufficient documentation

## 2013-05-07 DIAGNOSIS — Z8719 Personal history of other diseases of the digestive system: Secondary | ICD-10-CM | POA: Insufficient documentation

## 2013-05-07 DIAGNOSIS — R109 Unspecified abdominal pain: Secondary | ICD-10-CM | POA: Insufficient documentation

## 2013-05-07 DIAGNOSIS — Z8739 Personal history of other diseases of the musculoskeletal system and connective tissue: Secondary | ICD-10-CM | POA: Insufficient documentation

## 2013-05-07 DIAGNOSIS — R52 Pain, unspecified: Secondary | ICD-10-CM | POA: Insufficient documentation

## 2013-05-07 DIAGNOSIS — N390 Urinary tract infection, site not specified: Secondary | ICD-10-CM | POA: Insufficient documentation

## 2013-05-07 DIAGNOSIS — Z7982 Long term (current) use of aspirin: Secondary | ICD-10-CM | POA: Insufficient documentation

## 2013-05-07 DIAGNOSIS — I10 Essential (primary) hypertension: Secondary | ICD-10-CM | POA: Insufficient documentation

## 2013-05-07 DIAGNOSIS — Z8669 Personal history of other diseases of the nervous system and sense organs: Secondary | ICD-10-CM | POA: Insufficient documentation

## 2013-05-07 DIAGNOSIS — Z79899 Other long term (current) drug therapy: Secondary | ICD-10-CM | POA: Insufficient documentation

## 2013-05-07 NOTE — ED Notes (Signed)
PT. REPORTS RIGHT FLANK PAIN ONSET THIS MORNING WORSE THIS EVENING , DENIES INJURY OR FALL , NO HEMATURIA OR DYSURIA .

## 2013-05-08 ENCOUNTER — Emergency Department (HOSPITAL_COMMUNITY)
Admission: EM | Admit: 2013-05-08 | Discharge: 2013-05-08 | Disposition: A | Discharge status: Home / Self Care | Payer: Medicare Other | Attending: Emergency Medicine | Admitting: Emergency Medicine

## 2013-05-08 DIAGNOSIS — R109 Unspecified abdominal pain: Secondary | ICD-10-CM

## 2013-05-08 DIAGNOSIS — N390 Urinary tract infection, site not specified: Secondary | ICD-10-CM

## 2013-05-08 HISTORY — DX: Unspecified osteoarthritis, unspecified site: M19.90

## 2013-05-08 HISTORY — DX: Irritable bowel syndrome, unspecified: K58.9

## 2013-05-08 HISTORY — DX: Urinary tract infection, site not specified: N39.0

## 2013-05-08 HISTORY — DX: Unspecified glaucoma: H40.9

## 2013-05-08 HISTORY — DX: Diverticulosis of intestine, part unspecified, without perforation or abscess without bleeding: K57.90

## 2013-05-08 LAB — URINE MICROSCOPIC-ADD ON

## 2013-05-08 LAB — URINALYSIS, ROUTINE W REFLEX MICROSCOPIC
Bilirubin Urine: NEGATIVE
Ketones, ur: NEGATIVE mg/dL
Nitrite: NEGATIVE
Protein, ur: NEGATIVE mg/dL
pH: 6 (ref 5.0–8.0)

## 2013-05-08 LAB — CBC WITH DIFFERENTIAL/PLATELET
Basophils Absolute: 0 10*3/uL (ref 0.0–0.1)
Basophils Relative: 0 % (ref 0–1)
Eosinophils Absolute: 0.1 10*3/uL (ref 0.0–0.7)
HCT: 40.4 % (ref 36.0–46.0)
Hemoglobin: 14 g/dL (ref 12.0–15.0)
MCH: 32.8 pg (ref 26.0–34.0)
MCHC: 34.7 g/dL (ref 30.0–36.0)
Monocytes Absolute: 0.8 10*3/uL (ref 0.1–1.0)
Monocytes Relative: 9 % (ref 3–12)
Neutro Abs: 6.9 10*3/uL (ref 1.7–7.7)
RDW: 14.4 % (ref 11.5–15.5)

## 2013-05-08 LAB — COMPREHENSIVE METABOLIC PANEL
AST: 23 U/L (ref 0–37)
Albumin: 3.2 g/dL — ABNORMAL LOW (ref 3.5–5.2)
BUN: 13 mg/dL (ref 6–23)
Calcium: 9.1 mg/dL (ref 8.4–10.5)
Creatinine, Ser: 0.66 mg/dL (ref 0.50–1.10)
Total Protein: 6.4 g/dL (ref 6.0–8.3)

## 2013-05-08 MED ORDER — CIPROFLOXACIN HCL 500 MG PO TABS
500.0000 mg | ORAL_TABLET | Freq: Once | ORAL | Status: AC
  Administered 2013-05-08: 500 mg via ORAL
  Filled 2013-05-08: qty 1

## 2013-05-08 MED ORDER — HYDROCODONE-ACETAMINOPHEN 5-325 MG PO TABS: 1.0000 | ORAL_TABLET | Freq: Once | ORAL | Status: DC

## 2013-05-08 MED ORDER — FENTANYL CITRATE 0.05 MG/ML IJ SOLN
50.0000 ug | Freq: Once | INTRAMUSCULAR | Status: AC
  Administered 2013-05-08: 50 ug via INTRAVENOUS
  Filled 2013-05-08: qty 2

## 2013-05-08 MED ORDER — SULFAMETHOXAZOLE-TMP DS 800-160 MG PO TABS: 1.0000 | ORAL_TABLET | Freq: Once | ORAL | Status: DC

## 2013-05-08 MED ORDER — DIAZEPAM 5 MG PO TABS
5.0000 mg | ORAL_TABLET | Freq: Once | ORAL | Status: AC
  Administered 2013-05-08: 5 mg via ORAL
  Filled 2013-05-08: qty 1

## 2013-05-08 MED ORDER — CIPROFLOXACIN HCL 500 MG PO TABS: 500.0000 mg | ORAL_TABLET | Freq: Two times a day (BID) | ORAL | Status: DC

## 2013-05-08 MED ORDER — DIAZEPAM 5 MG PO TABS: 5.0000 mg | ORAL_TABLET | Freq: Three times a day (TID) | ORAL | Status: DC | PRN

## 2013-05-08 MED ORDER — HYDROCODONE-ACETAMINOPHEN 5-325 MG PO TABS: 1.0000 | ORAL_TABLET | Freq: Four times a day (QID) | ORAL | Status: DC | PRN

## 2013-05-08 NOTE — ED Provider Notes (Signed)
History     CSN: [id number]  Arrival date & time 05/07/13  2315   First MD Initiated Contact with Patient 05/08/13 0027      No chief complaint on file.   (Consider location/radiation/quality/duration/timing/severity/associated sxs/prior treatment) HPI 72 year old female presents to emergency department with complaint of right lower back pain, worse with movement, starting this morning upon waking.  She denies any new activities, no trauma.  Patient denies any fever or chills, no urinary symptoms.  No nausea no vomiting.  No abdominal pain.  No constipation.  No prior history of similar pain. Past Medical History  Diagnosis Date  . UTI (urinary tract infection)   . Hypertension   . IBS (irritable bowel syndrome)   . Diverticulosis   . Glaucoma   . UTI (lower urinary tract infection)   . Arthritis     Past Surgical History  Procedure Laterality Date  . Esophageal manometry  09/04/2012    Procedure: ESOPHAGEAL MANOMETRY (EM);  Surgeon: Vertell Novak., MD;  Location: [facility];  Service: Endoscopy;  Laterality: N/A;  . Total hip arthroplasty    . Cholecystectomy    . Hip surgery    . Neck surgery      Family History  Problem Relation Age of Onset  . Heart attack Mother   . Diabetes Mellitus II Mother     History  Substance Use Topics  . Smoking status: Never Smoker   . Smokeless tobacco: Not on file  . Alcohol Use: 1.2 oz/week    1 Glasses of wine, 1 Shots of liquor per week    OB History   Grav Para Term Preterm Abortions TAB SAB Ect Mult Living                  Review of Systems  See History of Present Illness; otherwise all other systems are reviewed and negative  Allergies  Review of patient's allergies indicates no known allergies.  Home Medications   Current Outpatient Rx  Name  Route  Sig  Dispense  Refill  . amLODipine-benazepril (LOTREL) 10-20 MG per capsule   Oral   Take 1 capsule by mouth daily.           Marland Kitchen aspirin 81 MG EC  tablet   Oral   Take 81 mg by mouth daily.           Marland Kitchen BIOTIN PO   Oral   Take 1 tablet by mouth daily.         Marland Kitchen CALCIUM-VITAMIN D PO   Oral   Take 1 tablet by mouth daily.         . clidinium-chlordiazePOXIDE (LIBRAX) 2.5-5 MG per capsule   Oral   Take 1 capsule by mouth 2 (two) times daily.         Marland Kitchen escitalopram (LEXAPRO) 20 MG tablet   Oral   Take 20 mg by mouth daily.         Maxwell Caul Bicarbonate (ZEGERID) 20-1100 MG CAPS   Oral   Take 1 capsule by mouth daily before breakfast.         . travoprost, benzalkonium, (TRAVATAN) 0.004 % ophthalmic solution   Both Eyes   Place 1 drop into both eyes at bedtime.           Marland Kitchen VITAMIN E PO   Oral   Take 1 tablet by mouth daily.         . ciprofloxacin (CIPRO) 500 MG tablet  Oral   Take 1 tablet (500 mg total) by mouth 2 (two) times daily.   10 tablet   0   . diazepam (VALIUM) 5 MG tablet   Oral   Take 1 tablet (5 mg total) by mouth every 8 (eight) hours as needed (muscle spasm).   30 tablet   0   . HYDROcodone-acetaminophen (NORCO/VICODIN) 5-325 MG per tablet   Oral   Take 1 tablet by mouth every 6 (six) hours as needed for pain. You may take 1-2 tab q 4-6 hours prn pain   30 tablet   0     BP 120/53  Pulse 81  Temp(Src) 98.1 F (36.7 C) (Oral)  Resp 20  SpO2 99%  Physical Exam  Nursing note and vitals reviewed. Constitutional: She is oriented to person, place, and time. She appears well-developed and well-nourished.  HENT:  Head: Normocephalic and atraumatic.  Nose: Nose normal.  Mouth/Throat: Oropharynx is clear and moist.  Eyes: Conjunctivae and EOM are normal. Pupils are equal, round, and reactive to light.  Neck: Normal range of motion. Neck supple. No JVD present. No tracheal deviation present. No thyromegaly present.  Cardiovascular: Normal rate, regular rhythm, normal heart sounds and intact distal pulses.  Exam reveals no gallop and no friction rub.   No murmur  heard. Pulmonary/Chest: Effort normal and breath sounds normal. No stridor. No respiratory distress. She has no wheezes. She has no rales. She exhibits no tenderness.  Abdominal: Soft. Bowel sounds are normal. She exhibits no distension and no mass. There is no tenderness. There is no rebound and no guarding.  Musculoskeletal: Normal range of motion. She exhibits tenderness (patient with point tenderness to right SI joint.  Palpation of this area reproduces pain.  No crepitus no overlying skin changes, no deformity noted). She exhibits no edema.  Lymphadenopathy:    She has no cervical adenopathy.  Neurological: She is alert and oriented to person, place, and time. She has normal reflexes. A cranial nerve deficit is present. She exhibits normal muscle tone. Coordination normal.  Skin: Skin is warm and dry. No rash noted. No erythema. No pallor.  Psychiatric: She has a normal mood and affect. Her behavior is normal. Judgment and thought content normal.    ED Course  Procedures (including critical care time)  Labs Reviewed  URINALYSIS, ROUTINE W REFLEX MICROSCOPIC - Abnormal; Notable for the following:    Leukocytes, UA SMALL (*)    All other components within normal limits  CBC WITH DIFFERENTIAL - Abnormal; Notable for the following:    Neutrophils Relative % 80 (*)    Lymphocytes Relative 10 (*)    All other components within normal limits  COMPREHENSIVE METABOLIC PANEL - Abnormal; Notable for the following:    Potassium 3.4 (*)    Glucose, Bld 115 (*)    Albumin 3.2 (*)    GFR calc non Af Amer 88 (*)    All other components within normal limits  URINE MICROSCOPIC-ADD ON   No results found.   1. Acute right flank pain   2. UTI (lower urinary tract infection)       MDM  72 year old female with right SI joint pain.  Patient also noted to have 11-20 white blood cells in her urine.  Recently admitted for pyelonephritis in April.  We'll cover with Cipro and send urine for  culture.        Olivia Mackie, MD 05/08/13 [phone]

## 2013-06-26 ENCOUNTER — Other Ambulatory Visit: Payer: Self-pay | Admitting: Orthopedic Surgery

## 2013-06-26 NOTE — Progress Notes (Signed)
Surgery scheduled on 07/11/13.  Need orders in EPIC.  Thank You.

## 2013-06-29 ENCOUNTER — Encounter (HOSPITAL_COMMUNITY): Payer: Self-pay | Admitting: Pharmacy Technician

## 2013-07-06 ENCOUNTER — Other Ambulatory Visit (HOSPITAL_COMMUNITY): Payer: Self-pay | Admitting: Orthopedic Surgery

## 2013-07-06 ENCOUNTER — Encounter (HOSPITAL_COMMUNITY)
Admission: RE | Admit: 2013-07-06 | Discharge: 2013-07-06 | Disposition: A | Discharge status: Home / Self Care | Payer: Medicare Other | Source: Ambulatory Visit | Attending: Orthopedic Surgery | Admitting: Orthopedic Surgery

## 2013-07-06 ENCOUNTER — Encounter (HOSPITAL_COMMUNITY): Payer: Self-pay

## 2013-07-06 DIAGNOSIS — R9431 Abnormal electrocardiogram [ECG] [EKG]: Secondary | ICD-10-CM | POA: Insufficient documentation

## 2013-07-06 DIAGNOSIS — Z01812 Encounter for preprocedural laboratory examination: Secondary | ICD-10-CM | POA: Insufficient documentation

## 2013-07-06 DIAGNOSIS — Z01810 Encounter for preprocedural cardiovascular examination: Secondary | ICD-10-CM | POA: Insufficient documentation

## 2013-07-06 HISTORY — DX: Thyrotoxicosis with diffuse goiter without thyrotoxic crisis or storm: E05.00

## 2013-07-06 HISTORY — DX: Anxiety disorder, unspecified: F41.9

## 2013-07-06 HISTORY — DX: Gastro-esophageal reflux disease without esophagitis: K21.9

## 2013-07-06 HISTORY — DX: Other specified postprocedural states: Z98.890

## 2013-07-06 HISTORY — DX: Other specified postprocedural states: R11.2

## 2013-07-06 HISTORY — DX: Headache: R51

## 2013-07-06 HISTORY — DX: Pneumonia, unspecified organism: J18.9

## 2013-07-06 LAB — CBC
HCT: 39.6 % (ref 36.0–46.0)
Hemoglobin: 13.6 g/dL (ref 12.0–15.0)
MCHC: 34.3 g/dL (ref 30.0–36.0)
RBC: 4.13 MIL/uL (ref 3.87–5.11)

## 2013-07-06 LAB — BASIC METABOLIC PANEL
BUN: 20 mg/dL (ref 6–23)
Chloride: 101 mEq/L (ref 96–112)
GFR calc Af Amer: 85 mL/min — ABNORMAL LOW (ref >90)
GFR calc non Af Amer: 74 mL/min — ABNORMAL LOW (ref >90)
Glucose, Bld: 86 mg/dL (ref 70–99)
Potassium: 4.6 mEq/L (ref 3.5–5.1)
Sodium: 137 mEq/L (ref 135–145)

## 2013-07-06 NOTE — Patient Instructions (Addendum)
20 ELEENA GRATER  07/06/2013   Your procedure is scheduled on: 07-11-2013  Report to [facility] at 800 AM.  Call this number if you have problems the morning of surgery [phone]   Remember:   Do not eat food or drink liquids :After Midnight.     Take these medicines the morning of surgery with A SIP OF WATER: celexa, omeprazole                     Do not wear jewelry, make-up or nail polish.  Do not wear lotions, powders, or perfumes. You may wear deodorant.   Men may shave face and neck.  Do not bring valuables to the hospital. [facility] IS NOT RESPONSIBLE FOR VALUEABLES.  Contacts, dentures or bridgework may not be worn into surgery.     Patients discharged the day of surgery will not be allowed to drive home.  Name and phone number of your driver: Homero Fellers (husband) [phone]    Call Birdie Sons RN pre op nurse if needed [phone]    FAILURE TO FOLLOW THESE INSTRUCTIONS MAY RESULT IN THE CANCELLATION OF YOUR SURGERY.  PATIENT SIGNATURE___________________________________________  NURSE SIGNATURE_____________________________________________

## 2013-07-06 NOTE — Progress Notes (Signed)
Chest x-ray 03/17/13 on EPIC

## 2013-07-10 DIAGNOSIS — S83289A Other tear of lateral meniscus, current injury, unspecified knee, initial encounter: Secondary | ICD-10-CM

## 2013-07-10 NOTE — H&P (Signed)
CC- Jodi Howell is a 72 y.o. female who presents with right knee pain.  HPI- . Knee Pain: Patient presents with knee pain involving the  right knee. Onset of the symptoms was several months ago. Inciting event: none known. Current symptoms include giving out, pain located laterally and swelling. Pain is aggravated by pivoting, rising after sitting, squatting and walking.  Patient has had no prior knee problems. Evaluation to date: MRI: abnormal lateral meniscal tear. Treatment to date: corticosteroid injection which was not very effective.  Past Medical History  Diagnosis Date  . Hypertension   . IBS (irritable bowel syndrome)   . Diverticulosis   . Glaucoma   . UTI (lower urinary tract infection) April 2014  . Arthritis   . PONV (postoperative nausea and vomiting)   . Graves disease     remission for many years now  . Anxiety   . Pneumonia june 2014    hx of   . GERD (gastroesophageal reflux disease)   . WGNFAOZH(086.5)     Past Surgical History  Procedure Laterality Date  . Esophageal manometry  09/04/2012    Procedure: ESOPHAGEAL MANOMETRY (EM);  Surgeon: Vertell Novak., MD;  Location: [facility];  Service: Endoscopy;  Laterality: N/A;  . Total hip arthroplasty Right 2005, 2014    x2  . Cholecystectomy    . Hip surgery Right     x4 total  . Neck surgery  2013    cervical fusion  . Fracture surgery  2014    femur with plate  . Peripherally inserted central catheter insertion  2014    inserted and removed x2  . Knee arthroscopy w/ acl reconstruction Left     Prior to Admission medications   Medication Sig Start Date End Date Taking? Authorizing Provider  aspirin 81 MG EC tablet Take 81 mg by mouth at bedtime.     Historical Provider, MD  BIOTIN PO Take 5,000 mg by mouth 2 (two) times daily.     Historical Provider, MD  Calcium Carbonate-Vitamin D (CALCIUM 600 + D PO) Take 1 tablet by mouth daily.    Historical Provider, MD  citalopram (CELEXA) 20 MG tablet  Take 20 mg by mouth every morning.    Historical Provider, MD  clidinium-chlordiazePOXIDE (LIBRAX) 2.5-5 MG per capsule Take 1 capsule by mouth 2 (two) times daily.    Historical Provider, MD  losartan (COZAAR) 50 MG tablet Take 50 mg by mouth every morning.    Historical Provider, MD  omeprazole (PRILOSEC) 40 MG capsule Take 40 mg by mouth 2 (two) times daily.    Historical Provider, MD  Travoprost, BAK Free, (TRAVATAN) 0.004 % SOLN ophthalmic solution Place 1 drop into both eyes at bedtime.    Historical Provider, MD  VITAMIN E PO Take 400 Units by mouth daily.     Historical Provider, MD   KNEE EXAM antalgic gait, soft tissue tenderness over lateral joint line, effusion, collateral ligaments intact, negative Lachman sign  Physical Examination: General appearance - alert, well appearing, and in no distress Mental status - alert, oriented to person, place, and time Chest - clear to auscultation, no wheezes, rales or rhonchi, symmetric air entry Heart - normal rate, regular rhythm, normal S1, S2, no murmurs, rubs, clicks or gallops Abdomen - soft, nontender, nondistended, no masses or organomegaly Neurological - alert, oriented, normal speech, no focal findings or movement disorder noted   Asessment/Plan--- Right knee lateral meniscal tear- - Plan right knee arthroscopy with meniscal  debridement. Procedure risks and potential comps discussed with patient who elects to proceed. Goals are decreased pain and increased function with a high likelihood of achieving both

## 2013-07-11 ENCOUNTER — Ambulatory Visit (HOSPITAL_COMMUNITY)
Admission: RE | Admit: 2013-07-11 | Discharge: 2013-07-11 | Disposition: A | Discharge status: Home / Self Care | Payer: Medicare Other | Source: Ambulatory Visit | Attending: Orthopedic Surgery | Admitting: Orthopedic Surgery

## 2013-07-11 ENCOUNTER — Encounter (HOSPITAL_COMMUNITY): Payer: Self-pay | Admitting: *Deleted

## 2013-07-11 ENCOUNTER — Encounter (HOSPITAL_COMMUNITY): Payer: Self-pay | Admitting: Anesthesiology

## 2013-07-11 ENCOUNTER — Encounter (HOSPITAL_COMMUNITY)
Admission: RE | Disposition: A | Discharge status: Home / Self Care | Payer: Self-pay | Source: Ambulatory Visit | Attending: Orthopedic Surgery

## 2013-07-11 ENCOUNTER — Ambulatory Visit (HOSPITAL_COMMUNITY): Payer: Medicare Other | Admitting: Anesthesiology

## 2013-07-11 DIAGNOSIS — M23359 Other meniscus derangements, posterior horn of lateral meniscus, unspecified knee: Secondary | ICD-10-CM | POA: Insufficient documentation

## 2013-07-11 DIAGNOSIS — S83281A Other tear of lateral meniscus, current injury, right knee, initial encounter: Secondary | ICD-10-CM

## 2013-07-11 DIAGNOSIS — Z79899 Other long term (current) drug therapy: Secondary | ICD-10-CM | POA: Insufficient documentation

## 2013-07-11 DIAGNOSIS — K219 Gastro-esophageal reflux disease without esophagitis: Secondary | ICD-10-CM | POA: Insufficient documentation

## 2013-07-11 DIAGNOSIS — Z96649 Presence of unspecified artificial hip joint: Secondary | ICD-10-CM | POA: Insufficient documentation

## 2013-07-11 DIAGNOSIS — S83289A Other tear of lateral meniscus, current injury, unspecified knee, initial encounter: Secondary | ICD-10-CM

## 2013-07-11 DIAGNOSIS — Z7982 Long term (current) use of aspirin: Secondary | ICD-10-CM | POA: Insufficient documentation

## 2013-07-11 DIAGNOSIS — I10 Essential (primary) hypertension: Secondary | ICD-10-CM | POA: Insufficient documentation

## 2013-07-11 HISTORY — PX: KNEE ARTHROSCOPY: SHX127

## 2013-07-11 SURGERY — ARTHROSCOPY, KNEE
Anesthesia: General | Site: Knee | Laterality: Right | Wound class: Clean

## 2013-07-11 MED ORDER — METOCLOPRAMIDE HCL 5 MG/ML IJ SOLN
INTRAMUSCULAR | Status: DC | PRN
  Administered 2013-07-11: 10 mg via INTRAVENOUS

## 2013-07-11 MED ORDER — LACTATED RINGERS IV SOLN: INTRAVENOUS | Status: DC

## 2013-07-11 MED ORDER — OXYCODONE HCL 5 MG PO TABS
5.0000 mg | ORAL_TABLET | ORAL | Status: DC | PRN
  Administered 2013-07-11: 5 mg via ORAL
  Filled 2013-07-11: qty 1

## 2013-07-11 MED ORDER — FENTANYL CITRATE 0.05 MG/ML IJ SOLN: 25.0000 ug | INTRAMUSCULAR | Status: DC | PRN

## 2013-07-11 MED ORDER — MIDAZOLAM HCL 5 MG/5ML IJ SOLN
INTRAMUSCULAR | Status: DC | PRN
  Administered 2013-07-11: 2 mg via INTRAVENOUS

## 2013-07-11 MED ORDER — ACETAMINOPHEN 10 MG/ML IV SOLN
1000.0000 mg | Freq: Once | INTRAVENOUS | Status: DC
  Filled 2013-07-11: qty 100

## 2013-07-11 MED ORDER — BUPIVACAINE-EPINEPHRINE 0.25% -1:200000 IJ SOLN
INTRAMUSCULAR | Status: DC | PRN
  Administered 2013-07-11: 20 mL

## 2013-07-11 MED ORDER — FENTANYL CITRATE 0.05 MG/ML IJ SOLN
INTRAMUSCULAR | Status: DC | PRN
  Administered 2013-07-11 (×2): 50 ug via INTRAVENOUS

## 2013-07-11 MED ORDER — LACTATED RINGERS IV SOLN
INTRAVENOUS | Status: DC | PRN
  Administered 2013-07-11 (×2): via INTRAVENOUS

## 2013-07-11 MED ORDER — BUPIVACAINE-EPINEPHRINE 0.25% -1:200000 IJ SOLN
INTRAMUSCULAR | Status: AC
  Filled 2013-07-11: qty 1

## 2013-07-11 MED ORDER — PROPOFOL 10 MG/ML IV BOLUS
INTRAVENOUS | Status: DC | PRN
  Administered 2013-07-11: 180 mg via INTRAVENOUS

## 2013-07-11 MED ORDER — CEFAZOLIN SODIUM-DEXTROSE 2-3 GM-% IV SOLR
INTRAVENOUS | Status: AC
  Filled 2013-07-11: qty 50

## 2013-07-11 MED ORDER — ONDANSETRON HCL 4 MG/2ML IJ SOLN
INTRAMUSCULAR | Status: DC | PRN
  Administered 2013-07-11: 4 mg via INTRAVENOUS

## 2013-07-11 MED ORDER — OXYCODONE HCL 5 MG PO TABS: 5.0000 mg | ORAL_TABLET | ORAL | Status: DC | PRN

## 2013-07-11 MED ORDER — SODIUM CHLORIDE 0.9 % IV SOLN: INTRAVENOUS | Status: DC

## 2013-07-11 MED ORDER — DEXAMETHASONE SODIUM PHOSPHATE 10 MG/ML IJ SOLN
10.0000 mg | Freq: Once | INTRAMUSCULAR | Status: AC
  Administered 2013-07-11: 10 mg via INTRAVENOUS

## 2013-07-11 MED ORDER — SODIUM CHLORIDE 0.9 % IR SOLN: Status: DC | PRN

## 2013-07-11 MED ORDER — METHOCARBAMOL 500 MG PO TABS: 500.0000 mg | ORAL_TABLET | Freq: Four times a day (QID) | ORAL | Status: DC

## 2013-07-11 MED ORDER — LACTATED RINGERS IR SOLN
Status: DC | PRN
  Administered 2013-07-11: 9000 mL

## 2013-07-11 MED ORDER — LIDOCAINE HCL (CARDIAC) 20 MG/ML IV SOLN
INTRAVENOUS | Status: DC | PRN
  Administered 2013-07-11: 50 mg via INTRAVENOUS

## 2013-07-11 MED ORDER — METHOCARBAMOL 500 MG PO TABS
500.0000 mg | ORAL_TABLET | Freq: Four times a day (QID) | ORAL | Status: DC
  Administered 2013-07-11: 500 mg via ORAL
  Filled 2013-07-11: qty 1

## 2013-07-11 MED ORDER — PROMETHAZINE HCL 25 MG/ML IJ SOLN: 6.2500 mg | INTRAMUSCULAR | Status: DC | PRN

## 2013-07-11 MED ORDER — CEFAZOLIN SODIUM-DEXTROSE 2-3 GM-% IV SOLR
2.0000 g | INTRAVENOUS | Status: AC
  Administered 2013-07-11: 2 g via INTRAVENOUS

## 2013-07-11 MED ORDER — KETOROLAC TROMETHAMINE 30 MG/ML IJ SOLN
INTRAMUSCULAR | Status: DC | PRN
  Administered 2013-07-11: 30 mg via INTRAVENOUS

## 2013-07-11 SURGICAL SUPPLY — 32 items
BANDAGE ELASTIC 6 VELCRO ST LF (GAUZE/BANDAGES/DRESSINGS) ×1 IMPLANT
BLADE 4.2CUDA (BLADE) ×2 IMPLANT
CLOTH BEACON ORANGE TIMEOUT ST (SAFETY) ×2 IMPLANT
CUFF TOURN SGL QUICK 34 (TOURNIQUET CUFF) ×2
CUFF TRNQT CYL 34X4X40X1 (TOURNIQUET CUFF) ×1 IMPLANT
DRAPE U-SHAPE 47X51 STRL (DRAPES) ×2 IMPLANT
DRSG EMULSION OIL 3X3 NADH (GAUZE/BANDAGES/DRESSINGS) ×2 IMPLANT
DRSG PAD ABDOMINAL 8X10 ST (GAUZE/BANDAGES/DRESSINGS) ×1 IMPLANT
DURAPREP 26ML APPLICATOR (WOUND CARE) ×2 IMPLANT
GLOVE BIO SURGEON STRL SZ8 (GLOVE) ×2 IMPLANT
GLOVE BIOGEL PI IND STRL 7.0 (GLOVE) IMPLANT
GLOVE BIOGEL PI IND STRL 7.5 (GLOVE) IMPLANT
GLOVE BIOGEL PI IND STRL 8 (GLOVE) ×2 IMPLANT
GLOVE BIOGEL PI INDICATOR 7.0 (GLOVE) ×1
GLOVE BIOGEL PI INDICATOR 7.5 (GLOVE) ×2
GLOVE BIOGEL PI INDICATOR 8 (GLOVE) ×2
GOWN PREVENTION PLUS XLARGE (GOWN DISPOSABLE) ×1 IMPLANT
GOWN STRL NON-REIN LRG LVL3 (GOWN DISPOSABLE) ×2 IMPLANT
IV LACTATED RINGER IRRG 3000ML (IV SOLUTION) ×6
IV LR IRRIG 3000ML ARTHROMATIC (IV SOLUTION) IMPLANT
MANIFOLD NEPTUNE II (INSTRUMENTS) ×3 IMPLANT
PACK ARTHROSCOPY WL (CUSTOM PROCEDURE TRAY) ×2 IMPLANT
PACK ICE MAXI GEL EZY WRAP (MISCELLANEOUS) ×6 IMPLANT
PADDING CAST COTTON 6X4 STRL (CAST SUPPLIES) ×3 IMPLANT
POSITIONER SURGICAL ARM (MISCELLANEOUS) ×2 IMPLANT
SET ARTHROSCOPY TUBING (MISCELLANEOUS) ×2
SET ARTHROSCOPY TUBING LN (MISCELLANEOUS) ×1 IMPLANT
SUT ETHILON 4 0 PS 2 18 (SUTURE) ×2 IMPLANT
SYR 20CC LL (SYRINGE) ×1 IMPLANT
TOWEL OR 17X26 10 PK STRL BLUE (TOWEL DISPOSABLE) ×2 IMPLANT
WAND 90 DEG TURBOVAC W/CORD (SURGICAL WAND) ×2 IMPLANT
WRAP KNEE MAXI GEL POST OP (GAUZE/BANDAGES/DRESSINGS) ×3 IMPLANT

## 2013-07-11 NOTE — Interval H&P Note (Signed)
History and Physical Interval Note:  07/11/2013 10:10 AM  Jodi Howell  has presented today for surgery, with the diagnosis of RIGHT KNEE LATERAL MENISCAL TEAR  The various methods of treatment have been discussed with the patient and family. After consideration of risks, benefits and other options for treatment, the patient has consented to  Procedure(s): RIGHT ARTHROSCOPY KNEE WITH DEBRIDEMENT (Right) as a surgical intervention .  The patient's history has been reviewed, patient examined, no change in status, stable for surgery.  I have reviewed the patient's chart and labs.  Questions were answered to the patient's satisfaction.     Loanne Drilling

## 2013-07-11 NOTE — Anesthesia Preprocedure Evaluation (Addendum)
Anesthesia Evaluation  Patient identified by MRN, date of birth, ID band Patient awake    Reviewed: Allergy & Precautions, H&P , NPO status , Patient's Chart, lab work & pertinent test results  History of Anesthesia Complications (+) PONV  Airway Mallampati: II TM Distance: >3 FB Neck ROM: Full    Dental  (+) Caps, Teeth Intact and Dental Advisory Given   Pulmonary pneumonia -, former smoker,  breath sounds clear to auscultation  Pulmonary exam normal       Cardiovascular hypertension, Pt. on medications Rhythm:Regular Rate:Normal     Neuro/Psych  Headaches, Anxiety negative psych ROS   GI/Hepatic Neg liver ROS, GERD-  Medicated,  Endo/Other  Hyperthyroidism   Renal/GU negative Renal ROS  negative genitourinary   Musculoskeletal negative musculoskeletal ROS (+)   Abdominal   Peds  Hematology negative hematology ROS (+)   Anesthesia Other Findings Caps upper teeth  Reproductive/Obstetrics                          Anesthesia Physical Anesthesia Plan  ASA: II  Anesthesia Plan: General   Post-op Pain Management:    Induction: Intravenous  Airway Management Planned: LMA  Additional Equipment:   Intra-op Plan:   Post-operative Plan: Extubation in OR  Informed Consent: I have reviewed the patients History and Physical, chart, labs and discussed the procedure including the risks, benefits and alternatives for the proposed anesthesia with the patient or authorized representative who has indicated his/her understanding and acceptance.   Dental advisory given  Plan Discussed with: CRNA  Anesthesia Plan Comments:         Anesthesia Quick Evaluation

## 2013-07-11 NOTE — Transfer of Care (Signed)
Immediate Anesthesia Transfer of Care Note  Patient: Jodi Howell  Procedure(s) Performed: Procedure(s): RIGHT ARTHROSCOPY KNEE WITH  DEBRIDEMENT AND  LATERAL MEDIAL MENISECTOMY (Right)  Patient Location: PACU  Anesthesia Type:General  Level of Consciousness: awake and alert   Airway & Oxygen Therapy: Patient Spontanous Breathing and Patient connected to face mask oxygen  Post-op Assessment: Report given to PACU RN and Post -op Vital signs reviewed and stable  Post vital signs: Reviewed and stable  Complications: No apparent anesthesia complications

## 2013-07-11 NOTE — Op Note (Signed)
Preoperative diagnosis-  Right knee lateral meniscal tear  Postoperative diagnosis Right- knee lateral meniscal tear   Procedure- Right knee arthroscopy with lateral meniscal debridement    Surgeon- Gus Rankin. Ariyana Faw, MD  Anesthesia-General  EBL-  minimal Complications- None  Condition- PACU - hemodynamically stable.  Brief clinical note- -Jodi Howell is a 72 y.o.  female with a several month history of right knee pain and mechanical symptoms. Exam and history suggested lateral meniscal tear confirmed by MRI. The patient presents now for arthroscopy and debridement   Procedure in detail -       After successful administration of General anesthetic, a tourmiquet is placed high on the Right  thigh and the Right lower extremity is prepped and draped in the usual sterile fashion. Time out is performed by the surgical team. Standard superomedial and inferolateral portal sites are marked and incisions made with an 11 blade. The inflow cannula is passed through the superomedial portal and camera through the inferolateral portal and inflow is initiated. Arthroscopic visualization proceeds.      The undersurface of the patella and trochlea are visualized and there are Grade II changes of the patella and trochlea without any unstable cartilage defects. The medial and lateral gutters are visualized and there are  no loose bodies. Flexion and valgus force is applied to the knee and the medial compartment is entered. A spinal needle is passed into the joint through the site marked for the inferomedial portal. A small incision is made and the dilator passed into the joint. The findings for the medial compartment are normal.     The intercondylar notch is visualized and the ACL appears normal. 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 The tear is debrided  to a stable base with baskets and a shaver and sealed off with the Arthrocare.      The joint is again inspected and there are no other tears, defects or loose bodies identified. The arthroscopic equipment is then removed from the inferior portals which are closed with interrupted 4-0 nylon. 20 ml of .25% Marcaine with epinephrine are injected through the inflow cannula and the cannula is then removed and the portal closed with nylon. The incisions are cleaned and dried and a bulky sterile dressing is applied. The patient is then awakened and transported to recovery in stable condition.   07/11/2013, 10:59 AM

## 2013-07-11 NOTE — Anesthesia Postprocedure Evaluation (Signed)
Anesthesia Post Note  Patient: Jodi Howell  Procedure(s) Performed: Procedure(s) (LRB): RIGHT ARTHROSCOPY KNEE WITH  DEBRIDEMENT AND  LATERAL MEDIAL MENISECTOMY (Right)  Anesthesia type: General  Patient location: PACU  Post pain: Pain level controlled  Post assessment: Post-op Vital signs reviewed  Last Vitals:  Filed Vitals:   07/11/13 1150  BP:   Pulse: 64  Temp:   Resp: 15    Post vital signs: Reviewed  Level of consciousness: sedated  Complications: No apparent anesthesia complications

## 2013-07-12 ENCOUNTER — Encounter (HOSPITAL_COMMUNITY): Payer: Self-pay | Admitting: Orthopedic Surgery

## 2014-01-08 ENCOUNTER — Other Ambulatory Visit (HOSPITAL_COMMUNITY): Payer: Self-pay | Admitting: Orthopedic Surgery

## 2014-01-08 DIAGNOSIS — M25551 Pain in right hip: Secondary | ICD-10-CM

## 2014-01-16 ENCOUNTER — Encounter (HOSPITAL_COMMUNITY)
Admission: RE | Admit: 2014-01-16 | Discharge: 2014-01-16 | Disposition: A | Discharge status: Home / Self Care | Payer: Medicare Other | Source: Ambulatory Visit | Attending: Orthopedic Surgery | Admitting: Orthopedic Surgery

## 2014-01-16 DIAGNOSIS — M25559 Pain in unspecified hip: Secondary | ICD-10-CM | POA: Insufficient documentation

## 2014-01-16 DIAGNOSIS — M25551 Pain in right hip: Secondary | ICD-10-CM

## 2014-01-16 MED ORDER — TECHNETIUM TC 99M MEDRONATE IV KIT
25.0000 | PACK | Freq: Once | INTRAVENOUS | Status: AC | PRN
  Administered 2014-01-16: 25 via INTRAVENOUS

## 2014-01-29 ENCOUNTER — Other Ambulatory Visit: Payer: Self-pay | Admitting: Obstetrics & Gynecology

## 2014-02-12 ENCOUNTER — Other Ambulatory Visit: Payer: Self-pay | Admitting: Orthopedic Surgery

## 2014-02-27 ENCOUNTER — Encounter (HOSPITAL_COMMUNITY): Payer: Self-pay | Admitting: Pharmacy Technician

## 2014-03-06 ENCOUNTER — Encounter (HOSPITAL_COMMUNITY): Payer: Self-pay

## 2014-03-06 ENCOUNTER — Encounter (HOSPITAL_COMMUNITY)
Admission: RE | Admit: 2014-03-06 | Discharge: 2014-03-06 | Disposition: A | Discharge status: Home / Self Care | Payer: Medicare Other | Source: Ambulatory Visit | Attending: Orthopedic Surgery | Admitting: Orthopedic Surgery

## 2014-03-06 DIAGNOSIS — Z01812 Encounter for preprocedural laboratory examination: Secondary | ICD-10-CM | POA: Insufficient documentation

## 2014-03-06 HISTORY — DX: Pain due to internal orthopedic prosthetic devices, implants and grafts, initial encounter: T84.84XA

## 2014-03-06 HISTORY — DX: Personal history of other diseases of the musculoskeletal system and connective tissue: Z87.39

## 2014-03-06 HISTORY — DX: Major depressive disorder, single episode, unspecified: F32.9

## 2014-03-06 HISTORY — DX: Depression, unspecified: F32.A

## 2014-03-06 HISTORY — DX: Psoriasis, unspecified: L40.9

## 2014-03-06 HISTORY — DX: Personal history of other diseases of the digestive system: Z87.19

## 2014-03-06 LAB — BASIC METABOLIC PANEL
BUN: 23 mg/dL (ref 6–23)
CO2: 26 mEq/L (ref 19–32)
Calcium: 9.3 mg/dL (ref 8.4–10.5)
Chloride: 103 mEq/L (ref 96–112)
Creatinine, Ser: 0.94 mg/dL (ref 0.50–1.10)
GFR, EST AFRICAN AMERICAN: 70 mL/min — AB (ref >90)
GFR, EST NON AFRICAN AMERICAN: 60 mL/min — AB (ref >90)
Glucose, Bld: 102 mg/dL — ABNORMAL HIGH (ref 70–99)
Potassium: 4.3 mEq/L (ref 3.7–5.3)
Sodium: 140 mEq/L (ref 137–147)

## 2014-03-06 LAB — CBC
HCT: 41.7 % (ref 36.0–46.0)
Hemoglobin: 14.5 g/dL (ref 12.0–15.0)
MCH: 32.7 pg (ref 26.0–34.0)
MCHC: 34.8 g/dL (ref 30.0–36.0)
MCV: 94.1 fL (ref 78.0–100.0)
Platelets: 213 10*3/uL (ref 150–400)
RBC: 4.43 MIL/uL (ref 3.87–5.11)
RDW: 13.2 % (ref 11.5–15.5)
WBC: 6.1 10*3/uL (ref 4.0–10.5)

## 2014-03-06 NOTE — Patient Instructions (Addendum)
Versie Starksileen L Micalizzi  03/06/2014                           YOUR PROCEDURE IS SCHEDULED ON: 03/14/14               PLEASE REPORT TO [facility] STAY CENTER AT : 11:15 AM               CALL THIS NUMBER IF ANY PROBLEMS THE DAY OF SURGERY :               [phone]                                REMEMBER:   Do not eat food or drink liquids AFTER MIDNIGHT   May have clear liquids UNTIL 6 HOURS BEFORE SURGERY ( 8:15 AM)               Take these medicines the morning of surgery with A SIP OF WATER: CITALOPRAM / LIBRAX / OMEPRAZOLE / USE EYE DROPS / MAY TAKE TRAMADOL IF NEEDED   Do not wear jewelry, make-up   Do not wear lotions, powders, or perfumes.   Do not shave legs or underarms 12 hrs. before surgery (men may shave face)  Do not bring valuables to the hospital.  Contacts, dentures or bridgework may not be worn into surgery.  Leave suitcase in the car. After surgery it may be brought to your room.  For patients admitted to the hospital more than one night, checkout time is            11:00 AM                                                       The day of discharge.   Patients discharged the day of surgery will not be allowed to drive home.            If going home same day of surgery, must have someone stay with you              FIRST 24 hrs at home and arrange for some one to drive you              home from hospital.    Special Instructions             Please read over the following fact sheets that you were given:               1. [facility] PREPARING FOR SURGERY SHEET               2. INCENTIVE SPIROMETER                                                X_____________________________________________________________________        Failure to follow these instructions may result in cancellation of your surgery

## 2014-03-09 ENCOUNTER — Emergency Department (HOSPITAL_COMMUNITY)
Admission: EM | Admit: 2014-03-09 | Discharge: 2014-03-10 | Disposition: A | Discharge status: Home / Self Care | Payer: Medicare Other | Attending: Emergency Medicine | Admitting: Emergency Medicine

## 2014-03-09 DIAGNOSIS — W19XXXA Unspecified fall, initial encounter: Secondary | ICD-10-CM

## 2014-03-09 DIAGNOSIS — Z8701 Personal history of pneumonia (recurrent): Secondary | ICD-10-CM | POA: Insufficient documentation

## 2014-03-09 DIAGNOSIS — Z87440 Personal history of urinary (tract) infections: Secondary | ICD-10-CM | POA: Insufficient documentation

## 2014-03-09 DIAGNOSIS — Z79899 Other long term (current) drug therapy: Secondary | ICD-10-CM | POA: Insufficient documentation

## 2014-03-09 DIAGNOSIS — Z96649 Presence of unspecified artificial hip joint: Secondary | ICD-10-CM | POA: Insufficient documentation

## 2014-03-09 DIAGNOSIS — F329 Major depressive disorder, single episode, unspecified: Secondary | ICD-10-CM | POA: Insufficient documentation

## 2014-03-09 DIAGNOSIS — H409 Unspecified glaucoma: Secondary | ICD-10-CM | POA: Insufficient documentation

## 2014-03-09 DIAGNOSIS — W1809XA Striking against other object with subsequent fall, initial encounter: Secondary | ICD-10-CM | POA: Insufficient documentation

## 2014-03-09 DIAGNOSIS — K219 Gastro-esophageal reflux disease without esophagitis: Secondary | ICD-10-CM | POA: Insufficient documentation

## 2014-03-09 DIAGNOSIS — S335XXA Sprain of ligaments of lumbar spine, initial encounter: Secondary | ICD-10-CM | POA: Insufficient documentation

## 2014-03-09 DIAGNOSIS — F3289 Other specified depressive episodes: Secondary | ICD-10-CM | POA: Insufficient documentation

## 2014-03-09 DIAGNOSIS — F411 Generalized anxiety disorder: Secondary | ICD-10-CM | POA: Insufficient documentation

## 2014-03-09 DIAGNOSIS — Z87891 Personal history of nicotine dependence: Secondary | ICD-10-CM | POA: Insufficient documentation

## 2014-03-09 DIAGNOSIS — Z872 Personal history of diseases of the skin and subcutaneous tissue: Secondary | ICD-10-CM | POA: Insufficient documentation

## 2014-03-09 DIAGNOSIS — M109 Gout, unspecified: Secondary | ICD-10-CM | POA: Insufficient documentation

## 2014-03-09 DIAGNOSIS — Y92009 Unspecified place in unspecified non-institutional (private) residence as the place of occurrence of the external cause: Secondary | ICD-10-CM | POA: Insufficient documentation

## 2014-03-09 DIAGNOSIS — S301XXA Contusion of abdominal wall, initial encounter: Secondary | ICD-10-CM | POA: Insufficient documentation

## 2014-03-09 DIAGNOSIS — S39012A Strain of muscle, fascia and tendon of lower back, initial encounter: Secondary | ICD-10-CM

## 2014-03-09 DIAGNOSIS — Y9389 Activity, other specified: Secondary | ICD-10-CM | POA: Insufficient documentation

## 2014-03-09 DIAGNOSIS — I10 Essential (primary) hypertension: Secondary | ICD-10-CM | POA: Insufficient documentation

## 2014-03-10 ENCOUNTER — Encounter (HOSPITAL_COMMUNITY): Payer: Self-pay | Admitting: Emergency Medicine

## 2014-03-10 ENCOUNTER — Emergency Department (HOSPITAL_COMMUNITY): Payer: Medicare Other

## 2014-03-10 MED ORDER — DOCUSATE SODIUM 100 MG PO CAPS
100.0000 mg | ORAL_CAPSULE | Freq: Once | ORAL | Status: AC
  Administered 2014-03-10: 100 mg via ORAL
  Filled 2014-03-10: qty 1

## 2014-03-10 MED ORDER — OXYCODONE-ACETAMINOPHEN 5-325 MG PO TABS
1.0000 | ORAL_TABLET | ORAL | Status: DC | PRN
  Administered 2014-03-10: 1 via ORAL
  Filled 2014-03-10: qty 1

## 2014-03-10 MED ORDER — OXYCODONE-ACETAMINOPHEN 5-325 MG PO TABS: 1.0000 | ORAL_TABLET | ORAL | Status: DC | PRN

## 2014-03-10 MED ORDER — DSS 100 MG PO CAPS: 100.0000 mg | ORAL_CAPSULE | Freq: Two times a day (BID) | ORAL | Status: AC | PRN

## 2014-03-10 NOTE — ED Provider Notes (Signed)
CSN: [id number]     Arrival date & time 03/09/14  2344 History   First MD Initiated Contact with Patient 03/10/14 0129     Chief Complaint  Patient presents with  . Fall     (Consider location/radiation/quality/duration/timing/severity/associated sxs/prior Treatment) HPI 73 year old female presents to emergency department from home with complaint of left hip and low back pain.  Patient has history of right hip arthroplasty with complications in 2012.  She is due to have a revision surgery on Thursday with Dr. Merlyn Albert.  Tonight she became unbalanced while in the bathroom and fell, striking her left back.  After her fall, she has been unable to bear weight to her left leg secondary to pain.  Patient has bruising to her left lower back.  She reports pain is in her left hip, and in her left sacroiliac joint.  No trauma to the right side.  She did not strike her head, no LOC Past Medical History  Diagnosis Date  . Hypertension   . IBS (irritable bowel syndrome)   . Diverticulosis   . Glaucoma   . UTI (lower urinary tract infection) April 2014  . Arthritis   . PONV (postoperative nausea and vomiting)   . Graves disease     remission for many years now  . Anxiety   . Pneumonia june 2014    hx of   . GERD (gastroesophageal reflux disease)   . Headache(784.0)   . History of gout     MANY YRS AGO  . Painful orthopaedic hardware     RT HIP  . Psoriasis   . H/O hiatal hernia   . Depression    Past Surgical History  Procedure Laterality Date  . Esophageal manometry  09/04/2012    Procedure: ESOPHAGEAL MANOMETRY (EM);  Surgeon: Vertell Novak., MD;  Location: [facility];  Service: Endoscopy;  Laterality: N/A;  . Total hip arthroplasty Right 2005, 2014    x 2  . Cholecystectomy    . Hip surgery Right     x4 total  . Neck surgery  2013    cervical fusion  . Fracture surgery  2014    femur with plate  . Peripherally inserted central catheter insertion  2014    inserted and  removed x2  . Knee arthroscopy w/ acl reconstruction Left   . Knee arthroscopy Right 07/11/2013    Procedure: RIGHT ARTHROSCOPY KNEE WITH  DEBRIDEMENT AND  LATERAL MEDIAL MENISECTOMY;  Surgeon: Loanne Drilling, MD;  Location: [facility];  Service: Orthopedics;  Laterality: Right;   Family History  Problem Relation Age of Onset  . Heart attack Mother   . Diabetes Mellitus II Mother    History  Substance Use Topics  . Smoking status: Former Smoker -- 1.00 packs/day for 50 years    Types: Cigarettes    Quit date: 11/29/2004  . Smokeless tobacco: Never Used  . Alcohol Use: 0.0 oz/week     Comment: 1 cocktail a night   OB History   Grav Para Term Preterm Abortions TAB SAB Ect Mult Living                 Review of Systems   See History of Present Illness; otherwise all other systems are reviewed and negative  Allergies  Review of patient's allergies indicates no known allergies.  Home Medications   Current Outpatient Rx  Name  Route  Sig  Dispense  Refill  . aspirin 81 MG EC tablet  Oral   Take 81 mg by mouth at bedtime. On hold for surgery         . Biotin (BIOTIN 5000) 5 MG CAPS   Oral   Take 1 capsule by mouth 2 (two) times daily. On hold         . Calcium Carbonate-Vitamin D (CALCIUM 600 + D PO)   Oral   Take 1 tablet by mouth daily. On hold         . citalopram (CELEXA) 20 MG tablet   Oral   Take 20 mg by mouth every morning.         . clidinium-chlordiazePOXIDE (LIBRAX) 2.5-5 MG per capsule   Oral   Take 1 capsule by mouth 2 (two) times daily.         Marland Kitchen. ibuprofen (ADVIL,MOTRIN) 200 MG tablet   Oral   Take 600 mg by mouth every 6 (six) hours as needed for mild pain or moderate pain.         Marland Kitchen. losartan (COZAAR) 50 MG tablet   Oral   Take 50 mg by mouth every morning.         Marland Kitchen. omeprazole (PRILOSEC) 40 MG capsule   Oral   Take 40 mg by mouth 2 (two) times daily.         . traMADol (ULTRAM) 50 MG tablet   Oral   Take 50 mg by mouth every 6  (six) hours as needed for moderate pain or severe pain.         . Travoprost, BAK Free, (TRAVATAN) 0.004 % SOLN ophthalmic solution   Both Eyes   Place 1 drop into both eyes at bedtime.         . vitamin E 400 UNIT capsule   Oral   Take 400 Units by mouth 2 (two) times daily. On hold         . docusate sodium 100 MG CAPS   Oral   Take 100 mg by mouth 2 (two) times daily as needed for mild constipation.   10 capsule   0   . oxyCODONE-acetaminophen (PERCOCET/ROXICET) 5-325 MG per tablet   Oral   Take 1 tablet by mouth every 4 (four) hours as needed for moderate pain.   30 tablet   0    BP 119/59  Pulse 69  Temp(Src) 98.4 F (36.9 C) (Oral)  Resp 16  SpO2 93% Physical Exam  Nursing note and vitals reviewed. Constitutional: She is oriented to person, place, and time. She appears well-developed and well-nourished. She appears distressed (uncomfortable appearing).  HENT:  Head: Normocephalic and atraumatic.  Nose: Nose normal.  Mouth/Throat: Oropharynx is clear and moist.  Eyes: Conjunctivae and EOM are normal. Pupils are equal, round, and reactive to light.  Neck: Normal range of motion. Neck supple. No JVD present. No tracheal deviation present. No thyromegaly present.  Cardiovascular: Normal rate, regular rhythm, normal heart sounds and intact distal pulses.  Exam reveals no gallop and no friction rub.   No murmur heard. Pulmonary/Chest: Effort normal and breath sounds normal. No stridor. No respiratory distress. She has no wheezes. She has no rales. She exhibits no tenderness.  Abdominal: Soft. Bowel sounds are normal. She exhibits no distension and no mass. There is no tenderness. There is no rebound and no guarding.  Musculoskeletal: Normal range of motion. She exhibits tenderness (patient has tenderness over left sacroiliac joint.  She also has tenderness around the site of the contusion of her left flank).  She exhibits no edema.  Patient has decreased range of  motion at the left hip, secondary to pain.  No crepitus, no deformity noted  Lymphadenopathy:    She has no cervical adenopathy.  Neurological: She is alert and oriented to person, place, and time. She has normal reflexes. No cranial nerve deficit. She exhibits normal muscle tone. Coordination normal.  Skin: Skin is warm and dry. No rash noted. No erythema. No pallor.  Psychiatric: She has a normal mood and affect. Her behavior is normal. Judgment and thought content normal.    ED Course  Procedures (including critical care time) Labs Review Labs Reviewed - No data to display Imaging Review Dg Hip Complete Left  03/10/2014   CLINICAL DATA:  Left hip pain  EXAM: LEFT HIP - COMPLETE 2+ VIEW  COMPARISON:  None.  FINDINGS: Right total hip arthroplasty device is identified. Claw device and cerclage wires are identified around the greater trochanter of the proximal right femur as well as the proximal shaft of the right femur. One of the wires appears to be oriented away from the hip joint and into the lateral soft tissues overlying the hip. The left hip appears located and intact. No evidence for acute fracture or subluxation.  Mild left hip osteoarthritis is noted. There is no evidence of hip fracture or dislocation. There is no evidence of arthropathy or other focal bone abnormality.  IMPRESSION: 1. No acute findings identified. 2. Previous right hip arthroplasty with evidence of arthroplasty revision with placement of claw device and cerclage wires.   Electronically Signed   By: Signa Kell M.D.   On: 03/10/2014 01:52     EKG Interpretation None      MDM   Final diagnoses:  Fall at home  Contusion, flank  Low back strain    73 year old female status post fall at home.  X-rays of left hip and back without fracture.  Suspect back strain of SI joint.  Patient feeling better after Percocet.  She has close followup cardia scheduled with Dr. Merlyn Albert.    Olivia Mackie, MD 03/10/14 [phone]

## 2014-03-10 NOTE — ED Notes (Signed)
Patient is alert and oriented x3.  She is being seen for a fall with pain in the left hip. She has not been able to stand or put any weight on the leg since falling.  Currently  She rates her pain 5 of 10.

## 2014-03-10 NOTE — Discharge Instructions (Signed)
Take medications as prescribed.  Do not take Ultram/tramadol and Percocet at the same time.  Follow up with your orthopedist.  Return to the emergency department for worsening condition or new concerning symptoms.  Expect to have some soreness on the left side for about 2 weeks.   Contusion A contusion is a deep bruise. Contusions are the result of an injury that caused bleeding under the skin. The contusion may turn blue, purple, or yellow. Minor injuries will give you a painless contusion, but more severe contusions may stay painful and swollen for a few weeks.  CAUSES  A contusion is usually caused by a blow, trauma, or direct force to an area of the body. SYMPTOMS   Swelling and redness of the injured area.  Bruising of the injured area.  Tenderness and soreness of the injured area.  Pain. DIAGNOSIS  The diagnosis can be made by taking a history and physical exam. An X-ray, CT scan, or MRI may be needed to determine if there were any associated injuries, such as fractures. TREATMENT  Specific treatment will depend on what area of the body was injured. In general, the best treatment for a contusion is resting, icing, elevating, and applying cold compresses to the injured area. Over-the-counter medicines may also be recommended for pain control. Ask your caregiver what the best treatment is for your contusion. HOME CARE INSTRUCTIONS   Put ice on the injured area.  Put ice in a plastic bag.  Place a towel between your skin and the bag.  Leave the ice on for 15-20 minutes, 03-04 times a day.  Only take over-the-counter or prescription medicines for pain, discomfort, or fever as directed by your caregiver. Your caregiver may recommend avoiding anti-inflammatory medicines (aspirin, ibuprofen, and naproxen) for 48 hours because these medicines may increase bruising.  Rest the injured area.  If possible, elevate the injured area to reduce swelling. SEEK IMMEDIATE MEDICAL CARE IF:    You have increased bruising or swelling.  You have pain that is getting worse.  Your swelling or pain is not relieved with medicines. MAKE SURE YOU:   Understand these instructions.  Will watch your condition.  Will get help right away if you are not doing well or get worse. Document Released: 08/25/2005 Document Revised: 02/07/2012 Document Reviewed: 09/20/2011 [facility] Patient Information 2014 [place], [place].  Fall Prevention and Home Safety Falls cause injuries and can affect all age groups. It is possible to use preventive measures to significantly decrease the likelihood of falls. There are many simple measures which can make your home safer and prevent falls. OUTDOORS  Repair cracks and edges of walkways and driveways.  Remove high doorway thresholds.  Trim shrubbery on the main path into your home.  Have good outside lighting.  Clear walkways of tools, rocks, debris, and clutter.  Check that handrails are not broken and are securely fastened. Both sides of steps should have handrails.  Have leaves, snow, and ice cleared regularly.  Use sand or salt on walkways during winter months.  In the garage, clean up grease or oil spills. BATHROOM  Install night lights.  Install grab bars by the toilet and in the tub and shower.  Use non-skid mats or decals in the tub or shower.  Place a plastic non-slip stool in the shower to sit on, if needed.  Keep floors dry and clean up all water on the floor immediately.  Remove soap buildup in the tub or shower on a regular basis.  Secure  bath mats with non-slip, double-sided rug tape.  Remove throw rugs and tripping hazards from the floors. BEDROOMS  Install night lights.  Make sure a bedside light is easy to reach.  Do not use oversized bedding.  Keep a telephone by your bedside.  Have a firm chair with side arms to use for getting dressed.  Remove throw rugs and tripping hazards from the  floor. KITCHEN  Keep handles on pots and pans turned toward the center of the stove. Use back burners when possible.  Clean up spills quickly and allow time for drying.  Avoid walking on wet floors.  Avoid hot utensils and knives.  Position shelves so they are not too high or low.  Place commonly used objects within easy reach.  If necessary, use a sturdy step stool with a grab bar when reaching.  Keep electrical cables out of the way.  Do not use floor polish or wax that makes floors slippery. If you must use wax, use non-skid floor wax.  Remove throw rugs and tripping hazards from the floor. STAIRWAYS  Never leave objects on stairs.  Place handrails on both sides of stairways and use them. Fix any loose handrails. Make sure handrails on both sides of the stairways are as long as the stairs.  Check carpeting to make sure it is firmly attached along stairs. Make repairs to worn or loose carpet promptly.  Avoid placing throw rugs at the top or bottom of stairways, or properly secure the rug with carpet tape to prevent slippage. Get rid of throw rugs, if possible.  Have an electrician put in a light switch at the top and bottom of the stairs. OTHER FALL PREVENTION TIPS  Wear low-heel or rubber-soled shoes that are supportive and fit well. Wear closed toe shoes.  When using a stepladder, make sure it is fully opened and both spreaders are firmly locked. Do not climb a closed stepladder.  Add color or contrast paint or tape to grab bars and handrails in your home. Place contrasting color strips on first and last steps.  Learn and use mobility aids as needed. Install an electrical emergency response system.  Turn on lights to avoid dark areas. Replace light bulbs that burn out immediately. Get light switches that glow.  Arrange furniture to create clear pathways. Keep furniture in the same place.  Firmly attach carpet with non-skid or double-sided tape.  Eliminate uneven  floor surfaces.  Select a carpet pattern that does not visually hide the edge of steps.  Be aware of all pets. OTHER HOME SAFETY TIPS  Set the water temperature for 120 F (48.8 C).  Keep emergency numbers on or near the telephone.  Keep smoke detectors on every level of the home and near sleeping areas. Document Released: 11/05/2002 Document Revised: 05/16/2012 Document Reviewed: 02/04/2012 [facility] Patient Information 2014 [place], [place].  Lumbosacral Strain Lumbosacral strain is a strain of any of the parts that make up your lumbosacral vertebrae. Your lumbosacral vertebrae are the bones that make up the lower third of your backbone. Your lumbosacral vertebrae are held together by muscles and tough, fibrous tissue (ligaments).  CAUSES  A sudden blow to your back can cause lumbosacral strain. Also, anything that causes an excessive stretch of the muscles in the low back can cause this strain. This is typically seen when people exert themselves strenuously, fall, lift heavy objects, bend, or crouch repeatedly. RISK FACTORS  Physically demanding work.  Participation in pushing or pulling sports or sports that  require sudden twist of the back (tennis, golf, baseball).  Weight lifting.  Excessive lower back curvature.  Forward-tilted pelvis.  Weak back or abdominal muscles or both.  Tight hamstrings. SIGNS AND SYMPTOMS  Lumbosacral strain may cause pain in the area of your injury or pain that moves (radiates) down your leg.  DIAGNOSIS Your health care provider can often diagnose lumbosacral strain through a physical exam. In some cases, you may need tests such as X-ray exams.  TREATMENT  Treatment for your lower back injury depends on many factors that your clinician will have to evaluate. However, most treatment will include the use of anti-inflammatory medicines. HOME CARE INSTRUCTIONS   Avoid hard physical activities (tennis, racquetball, waterskiing) if you are not in  proper physical condition for it. This may aggravate or create problems.  If you have a back problem, avoid sports requiring sudden body movements. Swimming and walking are generally safer activities.  Maintain good posture.  Maintain a healthy weight.  For acute conditions, you may put ice on the injured area.  Put ice in a plastic bag.  Place a towel between your skin and the bag.  Leave the ice on for 20 minutes, 2 3 times a day.  When the low back starts healing, stretching and strengthening exercises may be recommended. SEEK MEDICAL CARE IF:  Your back pain is getting worse.  You experience severe back pain not relieved with medicines. SEEK IMMEDIATE MEDICAL CARE IF:   You have numbness, tingling, weakness, or problems with the use of your arms or legs.  There is a change in bowel or bladder control.  You have increasing pain in any area of the body, including your belly (abdomen).  You notice shortness of breath, dizziness, or feel faint.  You feel sick to your stomach (nauseous), are throwing up (vomiting), or become sweaty.  You notice discoloration of your toes or legs, or your feet get very cold. MAKE SURE YOU:   Understand these instructions.  Will watch your condition.  Will get help right away if you are not doing well or get worse. Document Released: 08/25/2005 Document Revised: 09/05/2013 Document Reviewed: 07/04/2013 [facility] Patient Information 2014 [place], [place].

## 2014-03-13 DIAGNOSIS — T8484XA Pain due to internal orthopedic prosthetic devices, implants and grafts, initial encounter: Secondary | ICD-10-CM | POA: Diagnosis present

## 2014-03-13 NOTE — H&P (Signed)
CC- Jodi Howell is a 73 y.o. female who presents with right  hip pain  Hip Pain: Patient complains of right hip pain. Onset of the symptoms was several years ago. She has a complex history in regards to her right hip having undergone a 2 stage revision of the hip a few years ago for infection as well as fixation of a periprosthetic femur fracture. She has significant thigh pain overlying the cerclage cables around her femur. She presents now for hardware removal of the cables.  Past Medical History  Diagnosis Date  . Hypertension   . IBS (irritable bowel syndrome)   . Diverticulosis   . Glaucoma   . UTI (lower urinary tract infection) April 2014  . Arthritis   . PONV (postoperative nausea and vomiting)   . Graves disease     remission for many years now  . Anxiety   . Pneumonia june 2014    hx of   . GERD (gastroesophageal reflux disease)   . Headache(784.0)   . History of gout     MANY YRS AGO  . Painful orthopaedic hardware     RT HIP  . Psoriasis   . H/O hiatal hernia   . Depression     Past Surgical History  Procedure Laterality Date  . Esophageal manometry  09/04/2012    Procedure: ESOPHAGEAL MANOMETRY (EM);  Surgeon: Vertell NovakJames L Edwards Jr., MD;  Location: [facility];  Service: Endoscopy;  Laterality: N/A;  . Total hip arthroplasty Right 2005, 2014    x 2  . Cholecystectomy    . Hip surgery Right     x4 total  . Neck surgery  2013    cervical fusion  . Fracture surgery  2014    femur with plate  . Peripherally inserted central catheter insertion  2014    inserted and removed x2  . Knee arthroscopy w/ acl reconstruction Left   . Knee arthroscopy Right 07/11/2013    Procedure: RIGHT ARTHROSCOPY KNEE WITH  DEBRIDEMENT AND  LATERAL MEDIAL MENISECTOMY;  Surgeon: Loanne DrillingFrank V Fender Herder, MD;  Location: [facility];  Service: Orthopedics;  Laterality: Right;    Prior to Admission medications   Medication Sig Start Date End Date Taking? Authorizing Provider  aspirin 81 MG EC  tablet Take 81 mg by mouth at bedtime. On hold for surgery    Historical Provider, MD  Biotin (BIOTIN 5000) 5 MG CAPS Take 1 capsule by mouth 2 (two) times daily. On hold    Historical Provider, MD  Calcium Carbonate-Vitamin D (CALCIUM 600 + D PO) Take 1 tablet by mouth daily. On hold    Historical Provider, MD  citalopram (CELEXA) 20 MG tablet Take 20 mg by mouth every morning.    Historical Provider, MD  clidinium-chlordiazePOXIDE (LIBRAX) 2.5-5 MG per capsule Take 1 capsule by mouth 2 (two) times daily.    Historical Provider, MD  docusate sodium 100 MG CAPS Take 100 mg by mouth 2 (two) times daily as needed for mild constipation. 03/10/14   Olivia Mackielga M Otter, MD  ibuprofen (ADVIL,MOTRIN) 200 MG tablet Take 600 mg by mouth every 6 (six) hours as needed for mild pain or moderate pain.    Historical Provider, MD  losartan (COZAAR) 50 MG tablet Take 50 mg by mouth every morning.    Historical Provider, MD  omeprazole (PRILOSEC) 40 MG capsule Take 40 mg by mouth 2 (two) times daily.    Historical Provider, MD  oxyCODONE-acetaminophen (PERCOCET/ROXICET) 5-325 MG per tablet Take  1 tablet by mouth every 4 (four) hours as needed for moderate pain. 03/10/14   Olivia Mackielga M Otter, MD  traMADol (ULTRAM) 50 MG tablet Take 50 mg by mouth every 6 (six) hours as needed for moderate pain or severe pain.    Historical Provider, MD  Travoprost, BAK Free, (TRAVATAN) 0.004 % SOLN ophthalmic solution Place 1 drop into both eyes at bedtime.    Historical Provider, MD  vitamin E 400 UNIT capsule Take 400 Units by mouth 2 (two) times daily. On hold    Historical Provider, MD    Physical Examination: General appearance - alert, well appearing, and in no distress Mental status - alert, oriented to person, place, and time Chest - clear to auscultation, no wheezes, rales or rhonchi, symmetric air entry Heart - normal rate, regular rhythm, normal S1, S2, no murmurs, rubs, clicks or gallops Abdomen - soft, nontender, nondistended, no  masses or organomegaly Neurological - alert, oriented, normal speech, no focal findings or movement disorder noted  A right hip exam was performed. SKIN: intact SWELLING: none WARMTH: no warmth TENDERNESS: tender laterally over areas where hardware is present under the skin ROM: normal STRENGTH: normal GAIT: antalgic  ASSESSMENT:Painful hardware right hip/thigh  Plan     Hardware removal right femur. Discussed in detail with patient who elects to proceed   Gus RankinFrank V. Brette Cast, MD    03/13/2014, 10:47 PM

## 2014-03-14 ENCOUNTER — Encounter (HOSPITAL_COMMUNITY): Payer: Medicare Other | Admitting: Anesthesiology

## 2014-03-14 ENCOUNTER — Encounter (HOSPITAL_COMMUNITY): Payer: Self-pay | Admitting: *Deleted

## 2014-03-14 ENCOUNTER — Observation Stay (HOSPITAL_COMMUNITY)
Admission: RE | Admit: 2014-03-14 | Discharge: 2014-03-15 | Disposition: A | Discharge status: Home / Self Care | Payer: Medicare Other | Source: Ambulatory Visit | Attending: Orthopedic Surgery | Admitting: Orthopedic Surgery

## 2014-03-14 ENCOUNTER — Ambulatory Visit (HOSPITAL_COMMUNITY): Payer: Medicare Other | Admitting: Anesthesiology

## 2014-03-14 ENCOUNTER — Encounter (HOSPITAL_COMMUNITY)
Admission: RE | Disposition: A | Discharge status: Home / Self Care | Payer: Self-pay | Source: Ambulatory Visit | Attending: Orthopedic Surgery

## 2014-03-14 DIAGNOSIS — F329 Major depressive disorder, single episode, unspecified: Secondary | ICD-10-CM | POA: Insufficient documentation

## 2014-03-14 DIAGNOSIS — F411 Generalized anxiety disorder: Secondary | ICD-10-CM | POA: Insufficient documentation

## 2014-03-14 DIAGNOSIS — L408 Other psoriasis: Secondary | ICD-10-CM | POA: Insufficient documentation

## 2014-03-14 DIAGNOSIS — T8484XA Pain due to internal orthopedic prosthetic devices, implants and grafts, initial encounter: Secondary | ICD-10-CM

## 2014-03-14 DIAGNOSIS — K5730 Diverticulosis of large intestine without perforation or abscess without bleeding: Secondary | ICD-10-CM | POA: Insufficient documentation

## 2014-03-14 DIAGNOSIS — I10 Essential (primary) hypertension: Secondary | ICD-10-CM | POA: Insufficient documentation

## 2014-03-14 DIAGNOSIS — H409 Unspecified glaucoma: Secondary | ICD-10-CM | POA: Insufficient documentation

## 2014-03-14 DIAGNOSIS — T8489XA Other specified complication of internal orthopedic prosthetic devices, implants and grafts, initial encounter: Principal | ICD-10-CM | POA: Insufficient documentation

## 2014-03-14 DIAGNOSIS — Y831 Surgical operation with implant of artificial internal device as the cause of abnormal reaction of the patient, or of later complication, without mention of misadventure at the time of the procedure: Secondary | ICD-10-CM | POA: Insufficient documentation

## 2014-03-14 DIAGNOSIS — K219 Gastro-esophageal reflux disease without esophagitis: Secondary | ICD-10-CM | POA: Insufficient documentation

## 2014-03-14 DIAGNOSIS — K589 Irritable bowel syndrome without diarrhea: Secondary | ICD-10-CM | POA: Insufficient documentation

## 2014-03-14 DIAGNOSIS — Z7982 Long term (current) use of aspirin: Secondary | ICD-10-CM | POA: Insufficient documentation

## 2014-03-14 DIAGNOSIS — R51 Headache: Secondary | ICD-10-CM | POA: Insufficient documentation

## 2014-03-14 DIAGNOSIS — Z96649 Presence of unspecified artificial hip joint: Secondary | ICD-10-CM | POA: Insufficient documentation

## 2014-03-14 DIAGNOSIS — Z87891 Personal history of nicotine dependence: Secondary | ICD-10-CM | POA: Insufficient documentation

## 2014-03-14 DIAGNOSIS — F3289 Other specified depressive episodes: Secondary | ICD-10-CM | POA: Insufficient documentation

## 2014-03-14 HISTORY — PX: HARDWARE REMOVAL: SHX979

## 2014-03-14 SURGERY — REMOVAL, HARDWARE
Anesthesia: General | Site: Hip | Laterality: Right

## 2014-03-14 MED ORDER — CEFAZOLIN SODIUM-DEXTROSE 2-3 GM-% IV SOLR
INTRAVENOUS | Status: AC
  Filled 2014-03-14: qty 50

## 2014-03-14 MED ORDER — HYDROMORPHONE HCL PF 1 MG/ML IJ SOLN: 0.5000 mg | INTRAMUSCULAR | Status: DC | PRN

## 2014-03-14 MED ORDER — CILIDINIUM-CHLORDIAZEPOXIDE 2.5-5 MG PO CAPS
1.0000 | ORAL_CAPSULE | Freq: Two times a day (BID) | ORAL | Status: DC
  Administered 2014-03-15: 1 via ORAL
  Filled 2014-03-14 (×3): qty 1

## 2014-03-14 MED ORDER — SODIUM CHLORIDE 0.9 % IV SOLN
INTRAVENOUS | Status: DC
  Administered 2014-03-14: 20:00:00 via INTRAVENOUS

## 2014-03-14 MED ORDER — SUCCINYLCHOLINE CHLORIDE 20 MG/ML IJ SOLN
INTRAMUSCULAR | Status: DC | PRN
  Administered 2014-03-14: 100 mg via INTRAVENOUS

## 2014-03-14 MED ORDER — KETAMINE HCL 10 MG/ML IJ SOLN
INTRAMUSCULAR | Status: AC
  Filled 2014-03-14: qty 1

## 2014-03-14 MED ORDER — DEXAMETHASONE SODIUM PHOSPHATE 10 MG/ML IJ SOLN
INTRAMUSCULAR | Status: AC
  Filled 2014-03-14: qty 1

## 2014-03-14 MED ORDER — ONDANSETRON HCL 4 MG PO TABS: 4.0000 mg | ORAL_TABLET | Freq: Four times a day (QID) | ORAL | Status: DC | PRN

## 2014-03-14 MED ORDER — EPHEDRINE SULFATE 50 MG/ML IJ SOLN
INTRAMUSCULAR | Status: DC | PRN
  Administered 2014-03-14: 10 mg via INTRAVENOUS

## 2014-03-14 MED ORDER — FENTANYL CITRATE 0.05 MG/ML IJ SOLN: 25.0000 ug | INTRAMUSCULAR | Status: DC | PRN

## 2014-03-14 MED ORDER — LIDOCAINE HCL (CARDIAC) 20 MG/ML IV SOLN
INTRAVENOUS | Status: AC
  Filled 2014-03-14: qty 5

## 2014-03-14 MED ORDER — SODIUM CHLORIDE 0.9 % IV SOLN: INTRAVENOUS | Status: DC

## 2014-03-14 MED ORDER — ENOXAPARIN SODIUM 40 MG/0.4ML ~~LOC~~ SOLN
40.0000 mg | SUBCUTANEOUS | Status: DC
  Administered 2014-03-15: 40 mg via SUBCUTANEOUS
  Filled 2014-03-14 (×2): qty 0.4

## 2014-03-14 MED ORDER — METOCLOPRAMIDE HCL 5 MG/ML IJ SOLN: 5.0000 mg | Freq: Three times a day (TID) | INTRAMUSCULAR | Status: DC | PRN

## 2014-03-14 MED ORDER — ONDANSETRON HCL 4 MG/2ML IJ SOLN
INTRAMUSCULAR | Status: AC
  Filled 2014-03-14: qty 2

## 2014-03-14 MED ORDER — ACETAMINOPHEN 10 MG/ML IV SOLN
1000.0000 mg | Freq: Once | INTRAVENOUS | Status: AC
  Administered 2014-03-14: 1000 mg via INTRAVENOUS
  Filled 2014-03-14: qty 100

## 2014-03-14 MED ORDER — METHOCARBAMOL 500 MG PO TABS
500.0000 mg | ORAL_TABLET | Freq: Four times a day (QID) | ORAL | Status: DC | PRN
  Administered 2014-03-15: 500 mg via ORAL
  Filled 2014-03-14: qty 1

## 2014-03-14 MED ORDER — LACTATED RINGERS IV SOLN: INTRAVENOUS | Status: DC

## 2014-03-14 MED ORDER — DOCUSATE SODIUM 100 MG PO CAPS: 100.0000 mg | ORAL_CAPSULE | Freq: Two times a day (BID) | ORAL | Status: DC | PRN

## 2014-03-14 MED ORDER — METOCLOPRAMIDE HCL 10 MG PO TABS: 5.0000 mg | ORAL_TABLET | Freq: Three times a day (TID) | ORAL | Status: DC | PRN

## 2014-03-14 MED ORDER — CITALOPRAM HYDROBROMIDE 20 MG PO TABS
20.0000 mg | ORAL_TABLET | Freq: Every morning | ORAL | Status: DC
  Administered 2014-03-15: 20 mg via ORAL
  Filled 2014-03-14: qty 1

## 2014-03-14 MED ORDER — PROMETHAZINE HCL 25 MG/ML IJ SOLN: 6.2500 mg | INTRAMUSCULAR | Status: DC | PRN

## 2014-03-14 MED ORDER — LACTATED RINGERS IV SOLN
INTRAVENOUS | Status: DC | PRN
  Administered 2014-03-14: 14:00:00 via INTRAVENOUS

## 2014-03-14 MED ORDER — 0.9 % SODIUM CHLORIDE (POUR BTL) OPTIME
TOPICAL | Status: DC | PRN
  Administered 2014-03-14: 1000 mL

## 2014-03-14 MED ORDER — DEXAMETHASONE SODIUM PHOSPHATE 10 MG/ML IJ SOLN
10.0000 mg | Freq: Once | INTRAMUSCULAR | Status: AC
  Administered 2014-03-14 (×2): 5 mg via INTRAVENOUS

## 2014-03-14 MED ORDER — PANTOPRAZOLE SODIUM 40 MG PO TBEC
80.0000 mg | DELAYED_RELEASE_TABLET | Freq: Two times a day (BID) | ORAL | Status: DC
  Administered 2014-03-14 – 2014-03-15 (×2): 80 mg via ORAL
  Filled 2014-03-14 (×3): qty 2

## 2014-03-14 MED ORDER — PROPOFOL 10 MG/ML IV BOLUS
INTRAVENOUS | Status: AC
  Filled 2014-03-14: qty 20

## 2014-03-14 MED ORDER — CHLORHEXIDINE GLUCONATE 4 % EX LIQD: 60.0000 mL | Freq: Once | CUTANEOUS | Status: DC

## 2014-03-14 MED ORDER — OXYCODONE-ACETAMINOPHEN 5-325 MG PO TABS
1.0000 | ORAL_TABLET | ORAL | Status: DC | PRN
  Administered 2014-03-14 – 2014-03-15 (×4): 2 via ORAL
  Filled 2014-03-14 (×4): qty 2

## 2014-03-14 MED ORDER — LOSARTAN POTASSIUM 50 MG PO TABS
50.0000 mg | ORAL_TABLET | Freq: Every morning | ORAL | Status: DC
  Administered 2014-03-15: 50 mg via ORAL
  Filled 2014-03-14: qty 1

## 2014-03-14 MED ORDER — ASPIRIN 81 MG PO CHEW
81.0000 mg | CHEWABLE_TABLET | Freq: Every day | ORAL | Status: DC
  Administered 2014-03-14: 81 mg via ORAL
  Filled 2014-03-14 (×3): qty 1

## 2014-03-14 MED ORDER — FENTANYL CITRATE 0.05 MG/ML IJ SOLN
INTRAMUSCULAR | Status: DC | PRN
  Administered 2014-03-14: 100 ug via INTRAVENOUS

## 2014-03-14 MED ORDER — MORPHINE SULFATE 2 MG/ML IJ SOLN
1.0000 mg | INTRAMUSCULAR | Status: DC | PRN
  Filled 2014-03-14: qty 1

## 2014-03-14 MED ORDER — KETAMINE HCL 10 MG/ML IJ SOLN
INTRAMUSCULAR | Status: DC | PRN
  Administered 2014-03-14 (×2): 10 mg via INTRAVENOUS

## 2014-03-14 MED ORDER — LIDOCAINE HCL (CARDIAC) 20 MG/ML IV SOLN
INTRAVENOUS | Status: DC | PRN
  Administered 2014-03-14: 30 mg via INTRAVENOUS

## 2014-03-14 MED ORDER — TRAMADOL HCL 50 MG PO TABS: 50.0000 mg | ORAL_TABLET | Freq: Four times a day (QID) | ORAL | Status: DC | PRN

## 2014-03-14 MED ORDER — MIDAZOLAM HCL 2 MG/2ML IJ SOLN
INTRAMUSCULAR | Status: AC
  Filled 2014-03-14: qty 2

## 2014-03-14 MED ORDER — MEPERIDINE HCL 50 MG/ML IJ SOLN: 6.2500 mg | INTRAMUSCULAR | Status: DC | PRN

## 2014-03-14 MED ORDER — METHOCARBAMOL 100 MG/ML IJ SOLN
500.0000 mg | Freq: Four times a day (QID) | INTRAVENOUS | Status: DC | PRN
  Administered 2014-03-14: 500 mg via INTRAVENOUS
  Filled 2014-03-14: qty 5

## 2014-03-14 MED ORDER — CEFAZOLIN SODIUM-DEXTROSE 2-3 GM-% IV SOLR
2.0000 g | Freq: Four times a day (QID) | INTRAVENOUS | Status: AC
  Administered 2014-03-14 – 2014-03-15 (×3): 2 g via INTRAVENOUS
  Filled 2014-03-14 (×3): qty 50

## 2014-03-14 MED ORDER — BUPIVACAINE-EPINEPHRINE PF 0.25-1:200000 % IJ SOLN
INTRAMUSCULAR | Status: DC | PRN
  Administered 2014-03-14: 30 mL

## 2014-03-14 MED ORDER — LATANOPROST 0.005 % OP SOLN
1.0000 [drp] | Freq: Every day | OPHTHALMIC | Status: DC
  Administered 2014-03-14: 1 [drp] via OPHTHALMIC
  Filled 2014-03-14: qty 2.5

## 2014-03-14 MED ORDER — FENTANYL CITRATE 0.05 MG/ML IJ SOLN
INTRAMUSCULAR | Status: AC
  Filled 2014-03-14: qty 2

## 2014-03-14 MED ORDER — PROPOFOL 10 MG/ML IV BOLUS
INTRAVENOUS | Status: DC | PRN
  Administered 2014-03-14: 150 mg via INTRAVENOUS

## 2014-03-14 MED ORDER — ONDANSETRON HCL 4 MG/2ML IJ SOLN: 4.0000 mg | Freq: Four times a day (QID) | INTRAMUSCULAR | Status: DC | PRN

## 2014-03-14 MED ORDER — FENTANYL CITRATE 0.05 MG/ML IJ SOLN
INTRAMUSCULAR | Status: AC
  Filled 2014-03-14: qty 5

## 2014-03-14 MED ORDER — CEFAZOLIN SODIUM-DEXTROSE 2-3 GM-% IV SOLR
2.0000 g | INTRAVENOUS | Status: AC
Start: 2014-03-14 — End: 2014-03-14
  Administered 2014-03-14: 2 g via INTRAVENOUS

## 2014-03-14 MED ORDER — KETOROLAC TROMETHAMINE 30 MG/ML IJ SOLN
15.0000 mg | Freq: Once | INTRAMUSCULAR | Status: AC | PRN
  Filled 2014-03-14: qty 1

## 2014-03-14 MED ORDER — BUPIVACAINE-EPINEPHRINE PF 0.25-1:200000 % IJ SOLN
INTRAMUSCULAR | Status: AC
  Filled 2014-03-14: qty 30

## 2014-03-14 SURGICAL SUPPLY — 42 items
BANDAGE ELASTIC 6 VELCRO ST LF (GAUZE/BANDAGES/DRESSINGS) ×3 IMPLANT
BANDAGE ESMARK 6X9 LF (GAUZE/BANDAGES/DRESSINGS) ×1 IMPLANT
BNDG CMPR 9X6 STRL LF SNTH (GAUZE/BANDAGES/DRESSINGS) ×1
BNDG ESMARK 6X9 LF (GAUZE/BANDAGES/DRESSINGS) ×3
CLOSURE WOUND 1/2 X4 (GAUZE/BANDAGES/DRESSINGS) ×2
CUFF TOURN SGL QUICK 18 (TOURNIQUET CUFF) IMPLANT
CUFF TOURN SGL QUICK 34 (TOURNIQUET CUFF)
CUFF TRNQT CYL 34X4X40X1 (TOURNIQUET CUFF) IMPLANT
DRAPE C-ARM 42X120 X-RAY (DRAPES) ×1 IMPLANT
DRAPE C-ARMOR (DRAPES) ×1 IMPLANT
DRAPE EXTREMITY T 121X128X90 (DRAPE) ×3 IMPLANT
DRAPE INCISE IOBAN 66X45 STRL (DRAPES) ×3 IMPLANT
DRAPE ORTHO SPLIT 77X108 STRL (DRAPES) ×3
DRAPE SURG ORHT 6 SPLT 77X108 (DRAPES) IMPLANT
DRSG ADAPTIC 3X8 NADH LF (GAUZE/BANDAGES/DRESSINGS) ×3 IMPLANT
DRSG MEPILEX BORDER 4X4 (GAUZE/BANDAGES/DRESSINGS) ×4 IMPLANT
DRSG PAD ABDOMINAL 8X10 ST (GAUZE/BANDAGES/DRESSINGS) ×3 IMPLANT
DURAPREP 26ML APPLICATOR (WOUND CARE) ×3 IMPLANT
ELECT REM PT RETURN 9FT ADLT (ELECTROSURGICAL) ×3
ELECTRODE REM PT RTRN 9FT ADLT (ELECTROSURGICAL) ×1 IMPLANT
GLOVE BIO SURGEON STRL SZ7.5 (GLOVE) ×5 IMPLANT
GLOVE BIO SURGEON STRL SZ8 (GLOVE) ×6 IMPLANT
GLOVE BIOGEL PI IND STRL 8 (GLOVE) ×3 IMPLANT
GLOVE BIOGEL PI INDICATOR 8 (GLOVE) ×6
GOWN STRL REUS W/TWL LRG LVL3 (GOWN DISPOSABLE) ×3 IMPLANT
GOWN STRL REUS W/TWL XL LVL3 (GOWN DISPOSABLE) ×3 IMPLANT
KIT BASIN OR (CUSTOM PROCEDURE TRAY) ×3 IMPLANT
MANIFOLD NEPTUNE II (INSTRUMENTS) ×3 IMPLANT
NS IRRIG 1000ML POUR BTL (IV SOLUTION) ×3 IMPLANT
PACK TOTAL JOINT (CUSTOM PROCEDURE TRAY) ×3 IMPLANT
PADDING CAST COTTON 6X4 STRL (CAST SUPPLIES) ×3 IMPLANT
POSITIONER SURGICAL ARM (MISCELLANEOUS) ×3 IMPLANT
SPONGE GAUZE 4X4 12PLY (GAUZE/BANDAGES/DRESSINGS) ×3 IMPLANT
STAPLER VISISTAT 35W (STAPLE) IMPLANT
STRIP CLOSURE SKIN 1/2X4 (GAUZE/BANDAGES/DRESSINGS) ×3 IMPLANT
SUT MNCRL AB 4-0 PS2 18 (SUTURE) ×5 IMPLANT
SUT VIC AB 0 CT1 36 (SUTURE) ×6 IMPLANT
SUT VIC AB 2-0 CT1 27 (SUTURE) ×12
SUT VIC AB 2-0 CT1 TAPERPNT 27 (SUTURE) ×2 IMPLANT
TOWEL OR 17X26 10 PK STRL BLUE (TOWEL DISPOSABLE) ×6 IMPLANT
UNDERPAD 30X30 INCONTINENT (UNDERPADS AND DIAPERS) ×3 IMPLANT
WATER STERILE IRR 1500ML POUR (IV SOLUTION) ×3 IMPLANT

## 2014-03-14 NOTE — Transfer of Care (Signed)
Immediate Anesthesia Transfer of Care Note  Patient: Jodi Howell  Procedure(s) Performed: Procedure(s): HARDWARE REMOVAL OF RIGHT HIP (Right)  Patient Location: PACU  Anesthesia Type:General  Level of Consciousness: awake, alert , oriented and patient cooperative  Airway & Oxygen Therapy: Patient Spontanous Breathing and Patient connected to face mask oxygen  Post-op Assessment: Report given to PACU RN and Post -op Vital signs reviewed and stable  Post vital signs: stable  Complications: No apparent anesthesia complications

## 2014-03-14 NOTE — Interval H&P Note (Signed)
History and Physical Interval Note:  03/14/2014 1:10 PM  Versie StarksEileen L Manor  has presented today for surgery, with the diagnosis of PAINFUL HARDWARE OF RIGHT HIP  The various methods of treatment have been discussed with the patient and family. After consideration of risks, benefits and other options for treatment, the patient has consented to  Procedure(s): HARDWARE REMOVAL OF RIGHT HIP (Right) as a surgical intervention .  The patient's history has been reviewed, patient examined, no change in status, stable for surgery.  I have reviewed the patient's chart and labs.  Questions were answered to the patient's satisfaction.     Gus RankinFrank V Cloie Wooden

## 2014-03-14 NOTE — Anesthesia Postprocedure Evaluation (Signed)
  Anesthesia Post-op Note  Patient: Jodi Howell  Procedure(s) Performed: Procedure(s) (LRB): HARDWARE REMOVAL OF RIGHT HIP (Right)  Patient Location: PACU  Anesthesia Type: General  Level of Consciousness: awake and alert   Airway and Oxygen Therapy: Patient Spontanous Breathing  Post-op Pain: mild  Post-op Assessment: Post-op Vital signs reviewed, Patient's Cardiovascular Status Stable, Respiratory Function Stable, Patent Airway and No signs of Nausea or vomiting  Last Vitals:  Filed Vitals:   03/14/14 1545  BP: 147/56  Pulse: 72  Temp:   Resp: 10    Post-op Vital Signs: stable   Complications: No apparent anesthesia complications

## 2014-03-14 NOTE — Brief Op Note (Signed)
03/14/2014  3:13 PM  PATIENT:  Jodi StarksEileen L Diener  73 y.o. female  PRE-OPERATIVE DIAGNOSIS:  PAINFUL HARDWARE OF RIGHT HIP  POST-OPERATIVE DIAGNOSIS:  PAINFUL HARDWARE OF RIGHT HIP  PROCEDURE:  Procedure(s): HARDWARE REMOVAL OF RIGHT HIP (Right)  SURGEON:  Surgeon(s) and Role:    * Loanne DrillingFrank V Regino Fournet, MD - Primary  PHYSICIAN ASSISTANT:   ASSISTANTS: none   ANESTHESIA:   general  EBL:  Total I/O In: 0  Out: 30 [Blood:30]  BLOOD ADMINISTERED:none  DRAINS: none   LOCAL MEDICATIONS USED:  MARCAINE     COUNTS:  YES  TOURNIQUET:  * No tourniquets in log *  DICTATION: .Other Dictation: Dictation Number [id number]  PLAN OF CARE: Admit for overnight observation  PATIENT DISPOSITION:  PACU - hemodynamically stable.

## 2014-03-14 NOTE — Anesthesia Preprocedure Evaluation (Addendum)
Anesthesia Evaluation  Patient identified by MRN, date of birth, ID band Patient awake    Reviewed: Allergy & Precautions, H&P , NPO status , Patient's Chart, lab work & pertinent test results  History of Anesthesia Complications (+) PONV  Airway Mallampati: II TM Distance: >3 FB Neck ROM: Full    Dental no notable dental hx.    Pulmonary former smoker,  breath sounds clear to auscultation  Pulmonary exam normal       Cardiovascular hypertension, Pt. on medications Rhythm:Regular Rate:Normal     Neuro/Psych negative neurological ROS  negative psych ROS   GI/Hepatic Neg liver ROS, GERD-  Medicated,  Endo/Other    Renal/GU negative Renal ROS  negative genitourinary   Musculoskeletal negative musculoskeletal ROS (+)   Abdominal   Peds negative pediatric ROS (+)  Hematology negative hematology ROS (+)   Anesthesia Other Findings   Reproductive/Obstetrics negative OB ROS                         Anesthesia Physical Anesthesia Plan  ASA: II  Anesthesia Plan: General   Post-op Pain Management:    Induction: Intravenous  Airway Management Planned: Oral ETT  Additional Equipment:   Intra-op Plan:   Post-operative Plan:   Informed Consent: I have reviewed the patients History and Physical, chart, labs and discussed the procedure including the risks, benefits and alternatives for the proposed anesthesia with the patient or authorized representative who has indicated his/her understanding and acceptance.   Dental advisory given  Plan Discussed with: CRNA and Surgeon  Anesthesia Plan Comments:        Anesthesia Quick Evaluation

## 2014-03-15 ENCOUNTER — Encounter (HOSPITAL_COMMUNITY): Payer: Self-pay | Admitting: Orthopedic Surgery

## 2014-03-15 MED ORDER — OXYCODONE-ACETAMINOPHEN 5-325 MG PO TABS: 1.0000 | ORAL_TABLET | ORAL | Status: AC | PRN

## 2014-03-15 MED ORDER — TRAMADOL HCL 50 MG PO TABS: 50.0000 mg | ORAL_TABLET | Freq: Four times a day (QID) | ORAL | Status: AC | PRN

## 2014-03-15 MED ORDER — METHOCARBAMOL 500 MG PO TABS: 500.0000 mg | ORAL_TABLET | Freq: Four times a day (QID) | ORAL | Status: AC | PRN

## 2014-03-15 NOTE — Progress Notes (Signed)
   Subjective: 1 Day Post-Op Procedure(s) (LRB): HARDWARE REMOVAL OF RIGHT HIP (Right) Patient reports pain as mild.   Patient seen in rounds with Dr. Lequita HaltAluisio.  Discussed surgical findings.  Husband in room. Patient is well, but has had some minor complaints of pain in the hip, requiring pain medications Patient is ready to go home today.  Objective: Vital signs in last 24 hours: Temp:  [97.6 F (36.4 C)-98.4 F (36.9 C)] 97.6 F (36.4 C) (04/17 0555) Pulse Rate:  [71-78] 75 (04/17 0555) Resp:  [8-18] 16 (04/17 0555) BP: (118-147)/(51-83) 144/83 mmHg (04/17 0555) SpO2:  [93 %-100 %] 94 % (04/17 0555) Weight:  [88.168 kg (194 lb 6 oz)] 88.168 kg (194 lb 6 oz) (04/16 1940)  Intake/Output from previous day:  Intake/Output Summary (Last 24 hours) at 03/15/14 0905 Last data filed at 03/15/14 0700  Gross per 24 hour  Intake 2019.92 ml  Output   2030 ml  Net -10.08 ml    Intake/Output this shift:    Labs: No results found for this basename: HGB,  in the last 72 hours No results found for this basename: WBC, RBC, HCT, PLT,  in the last 72 hours No results found for this basename: NA, K, CL, CO2, BUN, CREATININE, GLUCOSE, CALCIUM,  in the last 72 hours No results found for this basename: LABPT, INR,  in the last 72 hours  EXAM: General - Patient is Alert, Appropriate and Oriented Extremity - Neurovascular intact Sensation intact distally Dorsiflexion/Plantar flexion intact Dressing - clean, dry Motor Function - intact, moving foot and toes well on exam.   Assessment/Plan: 1 Day Post-Op Procedure(s) (LRB): HARDWARE REMOVAL OF RIGHT HIP (Right) Procedure(s) (LRB): HARDWARE REMOVAL OF RIGHT HIP (Right) Past Medical History  Diagnosis Date  . Hypertension   . IBS (irritable bowel syndrome)   . Diverticulosis   . Glaucoma   . UTI (lower urinary tract infection) April 2014  . Arthritis   . PONV (postoperative nausea and vomiting)   . Graves disease     remission for  many years now  . Anxiety   . Pneumonia june 2014    hx of   . GERD (gastroesophageal reflux disease)   . Headache(784.0)   . History of gout     MANY YRS AGO  . Painful orthopaedic hardware     RT HIP  . Psoriasis   . H/O hiatal hernia   . Depression    Principal Problem:   Painful orthopaedic hardware  Estimated body mass index is 32.35 kg/(m^2) as calculated from the following:   Height as of this encounter: 5\' 5"  (1.651 m).   Weight as of this encounter: 88.168 kg (194 lb 6 oz). Discharge home with home health Diet - Cardiac diet Follow up - in 2 weeks Activity - WBAT Disposition - Rehab Condition Upon Discharge - Good D/C Meds - See DC Summary DVT Prophylaxis - Aspirin  Alexzandrew Perkins 03/15/2014, 9:05 AM

## 2014-03-15 NOTE — Discharge Summary (Signed)
Physician Discharge Summary   Patient ID: Jodi Howell MRN: [id number] DOB/AGE: 71-Sep-1942 71 y.o.  Admit date: 03/14/2014 Discharge date: 03/15/2014  Primary Diagnosis:  Painful hardware, right hip.  Admission Diagnoses:  Past Medical History  Diagnosis Date  . Hypertension   . IBS (irritable bowel syndrome)   . Diverticulosis   . Glaucoma   . UTI (lower urinary tract infection) April 2014  . Arthritis   . PONV (postoperative nausea and vomiting)   . Graves disease     remission for many years now  . Anxiety   . Pneumonia june 2014    hx of   . GERD (gastroesophageal reflux disease)   . Headache(784.0)   . History of gout     MANY YRS AGO  . Painful orthopaedic hardware     RT HIP  . Psoriasis   . H/O hiatal hernia   . Depression    Discharge Diagnoses:   Principal Problem:   Painful orthopaedic hardware  Estimated body mass index is 32.35 kg/(m^2) as calculated from the following:   Height as of this encounter: 5' 5"  (1.651 m).   Weight as of this encounter: 88.168 kg (194 lb 6 oz).  Procedure(s) (LRB): HARDWARE REMOVAL OF RIGHT HIP (Right)   Consults: None  HPI: Ms. Schoenfelder is a 73 year old female, long complex  history in regard to her right hip. She has had a 2 stage revision for  infection, also had ORIF of periprosthetic fracture. She is having pain  related to the cerclage cables around the femur. She presents now for  removal of these cables.  Laboratory Data: Hospital Outpatient Visit on 03/06/2014  Component Date Value Ref Range Status  . WBC 03/06/2014 6.1  4.0 - 10.5 K/uL Final  . RBC 03/06/2014 4.43  3.87 - 5.11 MIL/uL Final  . Hemoglobin 03/06/2014 14.5  12.0 - 15.0 g/dL Final  . HCT 03/06/2014 41.7  36.0 - 46.0 % Final  . MCV 03/06/2014 94.1  78.0 - 100.0 fL Final  . MCH 03/06/2014 32.7  26.0 - 34.0 pg Final  . MCHC 03/06/2014 34.8  30.0 - 36.0 g/dL Final  . RDW 03/06/2014 13.2  11.5 - 15.5 % Final  . Platelets 03/06/2014 213  150  - 400 K/uL Final  . Sodium 03/06/2014 140  137 - 147 mEq/L Final  . Potassium 03/06/2014 4.3  3.7 - 5.3 mEq/L Final  . Chloride 03/06/2014 103  96 - 112 mEq/L Final  . CO2 03/06/2014 26  19 - 32 mEq/L Final  . Glucose, Bld 03/06/2014 102* 70 - 99 mg/dL Final  . BUN 03/06/2014 23  6 - 23 mg/dL Final  . Creatinine, Ser 03/06/2014 0.94  0.50 - 1.10 mg/dL Final  . Calcium 03/06/2014 9.3  8.4 - 10.5 mg/dL Final  . GFR calc non Af Amer 03/06/2014 60* >90 mL/min Final  . GFR calc Af Amer 03/06/2014 70* >90 mL/min Final   Comment: (NOTE)                          The eGFR has been calculated using the CKD EPI equation.                          This calculation has not been validated in all clinical situations.  eGFR's persistently <90 mL/min signify possible Chronic Kidney                          Disease.     X-Rays:Dg Hip Complete Left  03/10/2014   CLINICAL DATA:  Left hip pain  EXAM: LEFT HIP - COMPLETE 2+ VIEW  COMPARISON:  None.  FINDINGS: Right total hip arthroplasty device is identified. Claw device and cerclage wires are identified around the greater trochanter of the proximal right femur as well as the proximal shaft of the right femur. One of the wires appears to be oriented away from the hip joint and into the lateral soft tissues overlying the hip. The left hip appears located and intact. No evidence for acute fracture or subluxation.  Mild left hip osteoarthritis is noted. There is no evidence of hip fracture or dislocation. There is no evidence of arthropathy or other focal bone abnormality.  IMPRESSION: 1. No acute findings identified. 2. Previous right hip arthroplasty with evidence of arthroplasty revision with placement of claw device and cerclage wires.   Electronically Signed   By: Kerby Moors M.D.   On: 03/10/2014 01:52    EKG: Orders placed during the hospital encounter of 07/06/13  . EKG 12-LEAD  . EKG 12-LEAD  . EKG 12-LEAD  . EKG 12-LEAD  . EKG       Hospital Course: Patient was admitted to [facility] and taken to the OR and underwent the above state procedure without complications.  Patient tolerated the procedure well and was later transferred to the recovery room and then to the orthopaedic floor for postoperative care.  They were given PO and IV analgesics for pain control following their surgery.  They were given 24 hours of postoperative antibiotics of  Anti-infectives   Start     Dose/Rate Route Frequency Ordered Stop   03/14/14 2000  ceFAZolin (ANCEF) IVPB 2 g/50 mL premix     2 g 100 mL/hr over 30 Minutes Intravenous Every 6 hours 03/14/14 1653 03/15/14 0922   03/14/14 1137  ceFAZolin (ANCEF) IVPB 2 g/50 mL premix     2 g 100 mL/hr over 30 Minutes Intravenous On call to O.R. 03/14/14 1137 03/14/14 1345     and started on DVT prophylaxis in the form of Aspirin.    The patient was allowed to be WBAT with therapy. Discharge planning was consulted to help with postop disposition and equipment needs.  Patient had a good night on the evening of surgery.  They started to get up OOB with therapy on day one and then allowed to go home.  Discharge home with home health  Diet - Cardiac diet  Follow up - in 2 weeks  Activity - WBAT  Disposition - Rehab  Condition Upon Discharge - Good  D/C Meds - See DC Summary  DVT Prophylaxis - Aspirin       Discharge Orders   Future Orders Complete By Expires   Call MD / Call 911  As directed    Change dressing  As directed    Constipation Prevention  As directed    Diet - low sodium heart healthy  As directed    Discharge instructions  As directed    Do not sit on low chairs, stoools or toilet seats, as it may be difficult to get up from low surfaces  As directed    Driving restrictions  As directed    Increase activity slowly as  tolerated  As directed    Lifting restrictions  As directed    Patient may shower  As directed    Weight bearing as tolerated  As directed     Questions:     Laterality:     Extremity:         Medication List    STOP taking these medications       ibuprofen 200 MG tablet  Commonly known as:  ADVIL,MOTRIN      TAKE these medications       aspirin 81 MG EC tablet  Take 81 mg by mouth at bedtime. On hold for surgery     BIOTIN 5000 5 MG Caps  Generic drug:  Biotin  Take 1 capsule by mouth 2 (two) times daily. On hold     CALCIUM 600 + D PO  Take 1 tablet by mouth daily. On hold     citalopram 20 MG tablet  Commonly known as:  CELEXA  Take 20 mg by mouth every morning.     clidinium-chlordiazePOXIDE 5-2.5 MG per capsule  Commonly known as:  LIBRAX  Take 1 capsule by mouth 2 (two) times daily.     DSS 100 MG Caps  Take 100 mg by mouth 2 (two) times daily as needed for mild constipation.     losartan 50 MG tablet  Commonly known as:  COZAAR  Take 50 mg by mouth every morning.     methocarbamol 500 MG tablet  Commonly known as:  ROBAXIN  Take 1 tablet (500 mg total) by mouth every 6 (six) hours as needed for muscle spasms.     omeprazole 40 MG capsule  Commonly known as:  PRILOSEC  Take 40 mg by mouth 2 (two) times daily.     oxyCODONE-acetaminophen 5-325 MG per tablet  Commonly known as:  PERCOCET/ROXICET  Take 1 tablet by mouth every 4 (four) hours as needed for moderate pain.     traMADol 50 MG tablet  Commonly known as:  ULTRAM  Take 1 tablet (50 mg total) by mouth every 6 (six) hours as needed for moderate pain.     Travoprost (BAK Free) 0.004 % Soln ophthalmic solution  Commonly known as:  TRAVATAN  Place 1 drop into both eyes at bedtime.     vitamin E 400 UNIT capsule  Take 400 Units by mouth 2 (two) times daily. On hold       Follow-up Information   Follow up with Gearlean Alf, MD. Schedule an appointment as soon as possible for a visit in 2 weeks.   Specialty:  Orthopedic Surgery   Contact information:   [address] [place] [address] [phone]        Signed: Mickel Crow 04/08/2014, 7:50 AM

## 2014-03-15 NOTE — Progress Notes (Signed)
RN reviewed discharge instructions with patient and family. All questions answered.  Paperwork and prescriptions given.   NT rolled patient down in wheelchair to family car.  

## 2014-03-15 NOTE — Op Note (Signed)
NAMMarland Kitchen:  Jodi Howell, Jodi Howell             ACCOUNT NO.:  [id number]  MEDICAL RECORD NO.:  [id number]  LOCATION:  1613                         [facility]  PHYSICIAN:  Ollen GrossFrank Qusai Kem, M.D.    DATE OF BIRTH:  1941-10-03  DATE OF PROCEDURE:  03/14/2014 DATE OF DISCHARGE:                              OPERATIVE REPORT   PREOPERATIVE DIAGNOSIS:  Painful hardware, right hip.  POSTOPERATIVE DIAGNOSIS:  Painful hardware, right hip.  PROCEDURE:  Hardware removal, right hip.  SURGEON:  Ollen GrossFrank Karlissa Aron, MD  ASSISTANT:  No assistant.  ANESTHESIA:  General.  ESTIMATED BLOOD LOSS:  Minimal.  DRAIN:  None.  COMPLICATIONS:  None.  CONDITION:  Stable to recovery.  BRIEF CLINICAL NOTE:  Ms. Jodi Howell is a 73 year old female, long complex history in regard to her right hip.  She has had a 2 stage revision for infection, also had ORIF of periprosthetic fracture.  She is having pain related to the cerclage cables around the femur.  She presents now for removal of these cables.  PROCEDURE IN DETAIL:  After successful administration of general anesthetic, the patient was placed in left lateral decubitus position with the right side up and held with the hip positioner.  Right lower extremity is isolated from perineum with plastic drapes and prepped and draped in the usual sterile fashion.  She had a cluster of 5 cables proximally, 3-0 nylon attached to the trochanteric plate and 2 just below that.  About a 3-4 inch incision was made over the greater trochanter, skin cut with 10 blade through subcutaneous tissue to the fascia lata which was incised in line with the skin incision.  The plate was identified and due to the cables were broken, the third one was cut in the plate and 3 cables removed.  I then went further distally identified the 2 other cables and cut those with a cable cutter and removed those.  Each of these cables had a small bursa with fluid around it.  We then thoroughly irrigated  this wound and packed with a sponge.  Distally, the site for the 2 other cables was approximated about 3 inch incision was made, and the skin cut with 10 blade through subcutaneous tissue to the fascia lata was incised in line with the skin incision.  I was able to palpate both the cables.  Incision was made in the fascia of vastus lateralis and the muscle had been scarred and incision was made through the scar to find the 2 cables which was subsequently cut and removed.  This wound was copiously irrigated with saline solution. Fascia of the vastus lateralis was closed with running 0 Vicryl, fascia lata closed with running 0 Vicryl.  Subcu closed with interrupted 2-0 Vicryl and subcuticular running 4-0 Monocryl.  Approximately 10 mL of 0.25% Marcaine with epi were injected into the subcu tissues prior to closure.  Proximally, we then removed the sponge and thoroughly irrigated with saline solution.  Fascia lata was closed with 0 Vicryl, subcu closed with 0, then 2-0 Vicryl, subcuticular running 4-0 Monocryl.  A 20 mL of 0.25% Marcaine with epi were injected into the subcu tissues and the deeper tissues.  The incisions were both clean  and dried.  Steri-Strips and bulky sterile dressing applied.  She was then awakened and transported to recovery in stable condition.     Ollen GrossFrank Kaisei Gilbo, M.D.     FA/MEDQ  D:  03/14/2014  T:  03/15/2014  Job:  [id number]

## 2014-03-15 NOTE — Discharge Instructions (Signed)
Pick up stool softner and laxative for home. Do not submerge incision under water. May shower staring Saturday Continue to use ice for pain and swelling from surgery. Resume Aspirin Call office for appointment.

## 2014-03-16 NOTE — Progress Notes (Signed)
[facility] requested [facility] for [facility]. Per dc summary, attending requesting HH. Notified [facility] of orders. HH will have MD sign orders and F2F. Pt states she has RW and 3n1 at home. Isidoro DonningAlesia Kaien Pezzullo RN CCM Case Mgmt phone [phone]

## 2014-09-17 ENCOUNTER — Other Ambulatory Visit: Payer: Self-pay | Admitting: Dermatology
